# Patient Record
Sex: Female | Born: 1940
Health system: Southern US, Community
[De-identification: ages and names within clinical notes are randomized; demographics above are authoritative.]

## PROBLEM LIST (undated history)

## (undated) DIAGNOSIS — E870 Hyperosmolality and hypernatremia: Secondary | ICD-10-CM

## (undated) DIAGNOSIS — M1990 Unspecified osteoarthritis, unspecified site: Secondary | ICD-10-CM

## (undated) DIAGNOSIS — Z860100 Personal history of colon polyps, unspecified: Secondary | ICD-10-CM

## (undated) DIAGNOSIS — R011 Cardiac murmur, unspecified: Secondary | ICD-10-CM

## (undated) DIAGNOSIS — Z8679 Personal history of other diseases of the circulatory system: Secondary | ICD-10-CM

## (undated) DIAGNOSIS — F32A Depression, unspecified: Secondary | ICD-10-CM

## (undated) DIAGNOSIS — I10 Essential (primary) hypertension: Secondary | ICD-10-CM

## (undated) DIAGNOSIS — F329 Major depressive disorder, single episode, unspecified: Secondary | ICD-10-CM

## (undated) DIAGNOSIS — E785 Hyperlipidemia, unspecified: Secondary | ICD-10-CM

## (undated) HISTORY — DX: Hyperlipidemia, unspecified: E78.5

## (undated) HISTORY — PX: RHINOPLASTY: SUR1284

## (undated) HISTORY — PX: CATARACT EXTRACTION: SUR2

## (undated) HISTORY — PX: KNEE ARTHROPLASTY: SHX992

## (undated) HISTORY — PX: CARDIAC CATHETERIZATION: SHX172

## (undated) HISTORY — PX: TUBAL LIGATION: SHX77

## (undated) HISTORY — PX: TONSILLECTOMY: SUR1361

## (undated) HISTORY — PX: FACIAL COSMETIC SURGERY: SHX629

## (undated) HISTORY — DX: Hyperosmolality and hypernatremia: E87.0

## (undated) HISTORY — PX: TOE SURGERY: SHX1073

---

## 1998-09-19 ENCOUNTER — Other Ambulatory Visit: Admission: RE | Admit: 1998-09-19 | Discharge: 1998-09-19 | Payer: Self-pay | Admitting: Gynecology

## 1999-05-15 ENCOUNTER — Other Ambulatory Visit: Admission: RE | Admit: 1999-05-15 | Discharge: 1999-05-15 | Payer: Self-pay | Admitting: Gynecology

## 1999-12-24 ENCOUNTER — Other Ambulatory Visit: Admission: RE | Admit: 1999-12-24 | Discharge: 1999-12-24 | Payer: Self-pay | Admitting: Gynecology

## 2001-01-26 ENCOUNTER — Other Ambulatory Visit: Admission: RE | Admit: 2001-01-26 | Discharge: 2001-01-26 | Payer: Self-pay | Admitting: Gynecology

## 2002-01-26 ENCOUNTER — Other Ambulatory Visit: Admission: RE | Admit: 2002-01-26 | Discharge: 2002-01-26 | Payer: Self-pay | Admitting: Gynecology

## 2003-01-31 ENCOUNTER — Other Ambulatory Visit: Admission: RE | Admit: 2003-01-31 | Discharge: 2003-01-31 | Payer: Self-pay | Admitting: Gynecology

## 2004-02-17 ENCOUNTER — Inpatient Hospital Stay (HOSPITAL_COMMUNITY): Admission: EM | Admit: 2004-02-17 | Discharge: 2004-02-20 | Payer: Self-pay | Admitting: Cardiology

## 2004-05-01 ENCOUNTER — Other Ambulatory Visit: Admission: RE | Admit: 2004-05-01 | Discharge: 2004-05-01 | Payer: Self-pay | Admitting: Gynecology

## 2005-06-24 ENCOUNTER — Other Ambulatory Visit: Admission: RE | Admit: 2005-06-24 | Discharge: 2005-06-24 | Payer: Self-pay | Admitting: Gynecology

## 2005-09-06 ENCOUNTER — Ambulatory Visit: Payer: Self-pay | Admitting: Cardiology

## 2010-09-30 ENCOUNTER — Encounter: Payer: Self-pay | Admitting: Internal Medicine

## 2010-11-06 ENCOUNTER — Other Ambulatory Visit: Payer: Self-pay | Admitting: Obstetrics and Gynecology

## 2010-11-06 DIAGNOSIS — N61 Mastitis without abscess: Secondary | ICD-10-CM

## 2010-11-08 ENCOUNTER — Ambulatory Visit
Admission: RE | Admit: 2010-11-08 | Discharge: 2010-11-08 | Disposition: A | Discharge status: Home / Self Care | Payer: Medicare Other | Source: Ambulatory Visit | Attending: Obstetrics and Gynecology | Admitting: Obstetrics and Gynecology

## 2010-11-08 DIAGNOSIS — N61 Mastitis without abscess: Secondary | ICD-10-CM

## 2011-12-20 ENCOUNTER — Encounter (INDEPENDENT_AMBULATORY_CARE_PROVIDER_SITE_OTHER): Payer: Self-pay

## 2012-04-16 ENCOUNTER — Encounter (INDEPENDENT_AMBULATORY_CARE_PROVIDER_SITE_OTHER): Payer: Self-pay | Admitting: *Deleted

## 2012-04-23 ENCOUNTER — Telehealth (INDEPENDENT_AMBULATORY_CARE_PROVIDER_SITE_OTHER): Payer: Self-pay | Admitting: *Deleted

## 2012-04-23 ENCOUNTER — Other Ambulatory Visit (INDEPENDENT_AMBULATORY_CARE_PROVIDER_SITE_OTHER): Payer: Self-pay | Admitting: *Deleted

## 2012-04-23 DIAGNOSIS — Z86010 Personal history of colonic polyps: Secondary | ICD-10-CM

## 2012-04-23 DIAGNOSIS — Z800 Family history of malignant neoplasm of digestive organs: Secondary | ICD-10-CM

## 2012-04-23 DIAGNOSIS — Z1211 Encounter for screening for malignant neoplasm of colon: Secondary | ICD-10-CM

## 2012-04-23 NOTE — Telephone Encounter (Signed)
Patient needs movi prep 

## 2012-04-24 MED ORDER — PEG-KCL-NACL-NASULF-NA ASC-C 100 G PO SOLR: 1.0000 | Freq: Once | ORAL | Status: DC

## 2012-05-06 ENCOUNTER — Telehealth (INDEPENDENT_AMBULATORY_CARE_PROVIDER_SITE_OTHER): Payer: Self-pay | Admitting: *Deleted

## 2012-05-06 NOTE — Telephone Encounter (Signed)
PCP/Requesting MD: vyas  Name & DOB: Jane [name] Jan 15, 2041     Procedure: tcs  Reason/Indication:  fam hx colon ca , hx polyps  Has patient had this procedure before?  yes  If so, when, by whom and where?  8/08  Is there a family history of colon cancer?  yes  Who?  What age when diagnosed?  mother  Is patient diabetic?   no      Does patient have prosthetic heart valve?  no  Do you have a pacemaker?  no  Has patient had joint replacement within last 12 months?  no  Is patient on Coumadin, Plavix and/or Aspirin? yes  Medications: asa 81 mg daily, lisinopril 20 mg bid, simvastatin 20 mg daily, citalopram 20 mg daily, estradiol/noreth tb 1/0.5 mg 1/2 tab daily, meclizine 25 mg 1/2 tab prn, vitamins, supllements  Allergies: pcn, cipro  Medication Adjustment: asa 2 days  Procedure date & time: 06/03/12 at 1200

## 2012-05-08 NOTE — Telephone Encounter (Signed)
agree

## 2012-05-26 ENCOUNTER — Encounter (HOSPITAL_COMMUNITY): Payer: Self-pay | Admitting: Pharmacy Technician

## 2012-05-28 ENCOUNTER — Encounter (INDEPENDENT_AMBULATORY_CARE_PROVIDER_SITE_OTHER): Payer: Self-pay | Admitting: *Deleted

## 2012-06-02 MED ORDER — SODIUM CHLORIDE 0.45 % IV SOLN
INTRAVENOUS | Status: DC
  Administered 2012-06-03: 1000 mL via INTRAVENOUS

## 2012-06-03 ENCOUNTER — Encounter (HOSPITAL_COMMUNITY)
Admission: RE | Disposition: A | Discharge status: Home / Self Care | Payer: Self-pay | Source: Ambulatory Visit | Attending: Internal Medicine

## 2012-06-03 ENCOUNTER — Ambulatory Visit (HOSPITAL_COMMUNITY)
Admission: RE | Admit: 2012-06-03 | Discharge: 2012-06-03 | Disposition: A | Discharge status: Home / Self Care | Payer: Medicare Other | Source: Ambulatory Visit | Attending: Internal Medicine | Admitting: Internal Medicine

## 2012-06-03 ENCOUNTER — Encounter (HOSPITAL_COMMUNITY): Payer: Self-pay | Admitting: *Deleted

## 2012-06-03 DIAGNOSIS — Z800 Family history of malignant neoplasm of digestive organs: Secondary | ICD-10-CM | POA: Insufficient documentation

## 2012-06-03 DIAGNOSIS — Z860100 Personal history of colon polyps, unspecified: Secondary | ICD-10-CM | POA: Insufficient documentation

## 2012-06-03 DIAGNOSIS — Z09 Encounter for follow-up examination after completed treatment for conditions other than malignant neoplasm: Secondary | ICD-10-CM | POA: Insufficient documentation

## 2012-06-03 DIAGNOSIS — K644 Residual hemorrhoidal skin tags: Secondary | ICD-10-CM

## 2012-06-03 DIAGNOSIS — D126 Benign neoplasm of colon, unspecified: Secondary | ICD-10-CM | POA: Insufficient documentation

## 2012-06-03 HISTORY — DX: Unspecified osteoarthritis, unspecified site: M19.90

## 2012-06-03 HISTORY — DX: Major depressive disorder, single episode, unspecified: F32.9

## 2012-06-03 HISTORY — PX: COLONOSCOPY: SHX5424

## 2012-06-03 HISTORY — DX: Depression, unspecified: F32.A

## 2012-06-03 HISTORY — DX: Cardiac murmur, unspecified: R01.1

## 2012-06-03 HISTORY — DX: Essential (primary) hypertension: I10

## 2012-06-03 HISTORY — DX: Personal history of colonic polyps: Z86.010

## 2012-06-03 HISTORY — DX: Personal history of colon polyps, unspecified: Z86.0100

## 2012-06-03 HISTORY — DX: Personal history of other diseases of the circulatory system: Z86.79

## 2012-06-03 SURGERY — COLONOSCOPY
Anesthesia: Moderate Sedation

## 2012-06-03 MED ORDER — MEPERIDINE HCL 50 MG/ML IJ SOLN
INTRAMUSCULAR | Status: DC | PRN
  Administered 2012-06-03 (×2): 25 mg via INTRAVENOUS

## 2012-06-03 MED ORDER — MIDAZOLAM HCL 5 MG/5ML IJ SOLN
INTRAMUSCULAR | Status: AC
  Filled 2012-06-03: qty 10

## 2012-06-03 MED ORDER — MIDAZOLAM HCL 5 MG/5ML IJ SOLN
INTRAMUSCULAR | Status: DC | PRN
  Administered 2012-06-03 (×3): 2 mg via INTRAVENOUS

## 2012-06-03 MED ORDER — STERILE WATER FOR IRRIGATION IR SOLN
Status: DC | PRN
  Administered 2012-06-03: 11:00:00

## 2012-06-03 MED ORDER — MEPERIDINE HCL 50 MG/ML IJ SOLN
INTRAMUSCULAR | Status: AC
  Filled 2012-06-03: qty 1

## 2012-06-03 NOTE — H&P (Signed)
Jane [name] is an 71 y.o. female.   Chief Complaint: Patient is here for colonoscopy. HPI: Patient is 71 year-old female with history of colonic polyps and family history of colon carcinoma.  Her last exam was in 2008. She denies abdominal pain rectal bleeding or change in bowel habits. Mother had colon carcinoma in her 58s or early 6s.  Past Medical History  Diagnosis Date  . Hypertension   . Heart murmur   . Arthritis   . Depression   . Hx of colonic polyps   . H/O: rheumatic fever     as child    Past Surgical History  Procedure Date  . Tubal ligation   . Toe surgery   . Tonsillectomy   . Rhinoplasty   . Facial cosmetic surgery   . Cardiac catheterization     History reviewed. No pertinent family history. Social History:  reports that she has never smoked. She does not have any smokeless tobacco history on file. She reports that she drinks alcohol. She reports that she does not use illicit drugs.  Allergies:  Allergies  Allergen Reactions  . Ciprofloxacin Hcl     Hand stiffness   . Other     Has a very low tolerance to sedation and stimulating drugs   . Penicillins Diarrhea and Rash    Medications Prior to Admission  Medication Sig Dispense Refill  . aspirin EC 81 MG tablet Take 81 mg by mouth at bedtime.      . calcium-vitamin D (OSCAL WITH D) 500-200 MG-UNIT per tablet Take 1 tablet by mouth 2 (two) times daily.      . citalopram (CELEXA) 20 MG tablet Take 20 mg by mouth at bedtime.      . Coenzyme Q10 (COQ10 PO) Take 1 tablet by mouth daily.      Marland Kitchen estradiol-norethindrone (ACTIVELLA) 1-0.5 MG per tablet Take 0.5 tablets by mouth at bedtime.      . fish oil-omega-3 fatty acids 1000 MG capsule Take 1 g by mouth 2 (two) times daily.      Marland Kitchen ketotifen (ALAWAY) 0.025 % ophthalmic solution Place 1 drop into both eyes daily as needed. For allergies      . lisinopril (PRINIVIL,ZESTRIL) 20 MG tablet Take 20 mg by mouth 2 (two) times daily.      . meclizine (ANTIVERT) 25  MG tablet Take 12.5-25 mg by mouth daily as needed. .-1/2-1 tablet as needed for vertigo      . Misc Natural Products (OSTEO BI-FLEX TRIPLE STRENGTH PO) Take 1 tablet by mouth 2 (two) times daily.      . Multiple Vitamins-Minerals (MULTIVITAMINS THER. W/MINERALS) TABS Take 1 tablet by mouth daily.      . naproxen sodium (ANAPROX) 220 MG tablet Take 220 mg by mouth daily as needed. For headaches      . polyvinyl alcohol-povidone (REFRESH) 1.4-0.6 % ophthalmic solution Place 1-2 drops into both eyes as needed. For dry eyes      . simvastatin (ZOCOR) 20 MG tablet Take 20 mg by mouth at bedtime.      . Tetrahydrozoline HCl (MURINE FOR RED EYES OP) Apply 1 drop to eye daily as needed. For red, dry eyes      . vitamin C (ASCORBIC ACID) 500 MG tablet Take 500 mg by mouth 2 (two) times daily.        No results found for this or any previous visit (from the past 48 hour(s)). No results found.  ROS  Blood pressure 152/75, pulse 76, temperature 98.2 F (36.8 C), temperature source Oral, resp. rate 19, height 5' 1.5" (1.562 m), weight 172 lb (78.019 kg), SpO2 96.00%. Physical Exam  Constitutional: She appears well-developed and well-nourished.  HENT:  Mouth/Throat: Oropharynx is clear and moist. No oropharyngeal exudate.  Eyes: Conjunctivae normal are normal. No scleral icterus.  Neck: No thyromegaly present.  Cardiovascular: Normal rate, regular rhythm and normal heart sounds.   No murmur heard. Respiratory: Effort normal and breath sounds normal.  GI: Soft. She exhibits no distension and no mass. There is no tenderness.  Musculoskeletal: She exhibits no edema.  Lymphadenopathy:    She has no cervical adenopathy.  Neurological: She is alert.  Skin: Skin is warm and dry.     Assessment/Plan History of colonic polyps. Family history of colon carcinoma. Surveillance colonoscopy.  REHMAN,NAJEEB U 06/03/2012, 11:29 AM

## 2012-06-03 NOTE — Op Note (Signed)
COLONOSCOPY PROCEDURE REPORT  PATIENT:  Jane [name]  MR#:  [id number] Birthdate:  May 31, 1941, 71 y.o., female Endoscopist:  Dr. Malissa Hippo, MD Referred By:  Dr. Ignatius Specking, MD Procedure Date: 06/03/2012  Procedure:   Colonoscopy  Indications:  Patient is 71 year old Caucasian female with history of colonic polyps and family history of colon carcinoma. She is undergoing surveillance colonoscopy.  Informed Consent:  The procedure and risks were reviewed with the patient and informed consent was obtained.  Medications:  Demerol 50 mg IV Versed 6 mg IV  Description of procedure:  After a digital rectal exam was performed, that colonoscope was advanced from the anus through the rectum and colon to the area of the cecum, ileocecal valve and appendiceal orifice. The cecum was deeply intubated. These structures were well-seen and photographed for the record. From the level of the cecum and ileocecal valve, the scope was slowly and cautiously withdrawn. The mucosal surfaces were carefully surveyed utilizing scope tip to flexion to facilitate fold flattening as needed. The scope was pulled down into the rectum where a thorough exam including retroflexion was performed.  Findings:   Prep satisfactory. Small polyp ablated via cold biopsy from hepatic flexure. Small polyp of there are cold biopsy from splenic flexure. Normal rectal mucosa. Small hemorrhoids below the dentate line.  Therapeutic/Diagnostic Maneuvers Performed:  See above  Complications:  None  Cecal Withdrawal Time:  12 minutes  Impression:  Examination performed to cecum. Two small polyps ablated via cold biopsy and submitted together(hepatic and splenic flexure). Small external hemorrhoids.  Recommendations:  Standard instructions given. I would be contacting patient with biopsy results and further recommendations.  Jane [name] U  06/03/2012 12:05 PM  CC: Dr. Ignatius Specking., MD & Dr. Bonnetta Barry ref. provider  found

## 2012-06-08 ENCOUNTER — Encounter (HOSPITAL_COMMUNITY): Payer: Self-pay | Admitting: Internal Medicine

## 2012-06-11 ENCOUNTER — Encounter (INDEPENDENT_AMBULATORY_CARE_PROVIDER_SITE_OTHER): Payer: Self-pay | Admitting: *Deleted

## 2015-12-26 ENCOUNTER — Other Ambulatory Visit: Payer: Self-pay | Admitting: Obstetrics & Gynecology

## 2015-12-26 DIAGNOSIS — Z124 Encounter for screening for malignant neoplasm of cervix: Secondary | ICD-10-CM | POA: Diagnosis not present

## 2015-12-28 LAB — CYTOLOGY - PAP

## 2016-01-04 DIAGNOSIS — Z299 Encounter for prophylactic measures, unspecified: Secondary | ICD-10-CM | POA: Diagnosis not present

## 2016-01-04 DIAGNOSIS — Z0000 Encounter for general adult medical examination without abnormal findings: Secondary | ICD-10-CM | POA: Diagnosis not present

## 2016-01-04 DIAGNOSIS — Z1211 Encounter for screening for malignant neoplasm of colon: Secondary | ICD-10-CM | POA: Diagnosis not present

## 2016-01-04 DIAGNOSIS — F329 Major depressive disorder, single episode, unspecified: Secondary | ICD-10-CM | POA: Diagnosis not present

## 2016-01-04 DIAGNOSIS — Z7189 Other specified counseling: Secondary | ICD-10-CM | POA: Diagnosis not present

## 2016-01-04 DIAGNOSIS — Z1389 Encounter for screening for other disorder: Secondary | ICD-10-CM | POA: Diagnosis not present

## 2016-01-05 DIAGNOSIS — R5383 Other fatigue: Secondary | ICD-10-CM | POA: Diagnosis not present

## 2016-01-05 DIAGNOSIS — E7800 Pure hypercholesterolemia, unspecified: Secondary | ICD-10-CM | POA: Diagnosis not present

## 2016-01-05 DIAGNOSIS — Z79899 Other long term (current) drug therapy: Secondary | ICD-10-CM | POA: Diagnosis not present

## 2016-01-31 DIAGNOSIS — E7800 Pure hypercholesterolemia, unspecified: Secondary | ICD-10-CM | POA: Diagnosis not present

## 2016-01-31 DIAGNOSIS — I10 Essential (primary) hypertension: Secondary | ICD-10-CM | POA: Diagnosis not present

## 2016-02-13 DIAGNOSIS — E2839 Other primary ovarian failure: Secondary | ICD-10-CM | POA: Diagnosis not present

## 2016-02-14 DIAGNOSIS — M546 Pain in thoracic spine: Secondary | ICD-10-CM | POA: Diagnosis not present

## 2016-02-14 DIAGNOSIS — M47812 Spondylosis without myelopathy or radiculopathy, cervical region: Secondary | ICD-10-CM | POA: Diagnosis not present

## 2016-02-14 DIAGNOSIS — M47816 Spondylosis without myelopathy or radiculopathy, lumbar region: Secondary | ICD-10-CM | POA: Diagnosis not present

## 2016-02-14 DIAGNOSIS — M9903 Segmental and somatic dysfunction of lumbar region: Secondary | ICD-10-CM | POA: Diagnosis not present

## 2016-02-14 DIAGNOSIS — M9901 Segmental and somatic dysfunction of cervical region: Secondary | ICD-10-CM | POA: Diagnosis not present

## 2016-02-14 DIAGNOSIS — M9902 Segmental and somatic dysfunction of thoracic region: Secondary | ICD-10-CM | POA: Diagnosis not present

## 2016-02-29 DIAGNOSIS — M2550 Pain in unspecified joint: Secondary | ICD-10-CM | POA: Diagnosis not present

## 2016-02-29 DIAGNOSIS — I10 Essential (primary) hypertension: Secondary | ICD-10-CM | POA: Diagnosis not present

## 2016-02-29 DIAGNOSIS — M8580 Other specified disorders of bone density and structure, unspecified site: Secondary | ICD-10-CM | POA: Diagnosis not present

## 2016-02-29 DIAGNOSIS — M1990 Unspecified osteoarthritis, unspecified site: Secondary | ICD-10-CM | POA: Diagnosis not present

## 2016-03-05 DIAGNOSIS — I10 Essential (primary) hypertension: Secondary | ICD-10-CM | POA: Diagnosis not present

## 2016-03-05 DIAGNOSIS — E7800 Pure hypercholesterolemia, unspecified: Secondary | ICD-10-CM | POA: Diagnosis not present

## 2016-03-08 DIAGNOSIS — M25562 Pain in left knee: Secondary | ICD-10-CM | POA: Diagnosis not present

## 2016-03-08 DIAGNOSIS — R030 Elevated blood-pressure reading, without diagnosis of hypertension: Secondary | ICD-10-CM | POA: Diagnosis not present

## 2016-03-08 DIAGNOSIS — I10 Essential (primary) hypertension: Secondary | ICD-10-CM | POA: Diagnosis not present

## 2016-03-08 DIAGNOSIS — E7800 Pure hypercholesterolemia, unspecified: Secondary | ICD-10-CM | POA: Diagnosis not present

## 2016-03-08 DIAGNOSIS — Z79899 Other long term (current) drug therapy: Secondary | ICD-10-CM | POA: Diagnosis not present

## 2016-03-08 DIAGNOSIS — M7122 Synovial cyst of popliteal space [Baker], left knee: Secondary | ICD-10-CM | POA: Diagnosis not present

## 2016-03-08 DIAGNOSIS — M25462 Effusion, left knee: Secondary | ICD-10-CM | POA: Diagnosis not present

## 2016-03-11 DIAGNOSIS — M25562 Pain in left knee: Secondary | ICD-10-CM | POA: Diagnosis not present

## 2016-03-11 DIAGNOSIS — Z789 Other specified health status: Secondary | ICD-10-CM | POA: Diagnosis not present

## 2016-03-11 DIAGNOSIS — Z299 Encounter for prophylactic measures, unspecified: Secondary | ICD-10-CM | POA: Diagnosis not present

## 2016-03-13 DIAGNOSIS — M25562 Pain in left knee: Secondary | ICD-10-CM | POA: Diagnosis not present

## 2016-03-20 ENCOUNTER — Other Ambulatory Visit: Payer: Self-pay | Admitting: Orthopedic Surgery

## 2016-03-20 DIAGNOSIS — M25562 Pain in left knee: Secondary | ICD-10-CM

## 2016-03-27 DIAGNOSIS — M7122 Synovial cyst of popliteal space [Baker], left knee: Secondary | ICD-10-CM | POA: Diagnosis not present

## 2016-03-27 DIAGNOSIS — S83242A Other tear of medial meniscus, current injury, left knee, initial encounter: Secondary | ICD-10-CM | POA: Diagnosis not present

## 2016-04-02 DIAGNOSIS — M23322 Other meniscus derangements, posterior horn of medial meniscus, left knee: Secondary | ICD-10-CM | POA: Diagnosis not present

## 2016-04-04 DIAGNOSIS — Z1231 Encounter for screening mammogram for malignant neoplasm of breast: Secondary | ICD-10-CM | POA: Diagnosis not present

## 2016-04-10 DIAGNOSIS — M23322 Other meniscus derangements, posterior horn of medial meniscus, left knee: Secondary | ICD-10-CM | POA: Diagnosis not present

## 2016-04-10 DIAGNOSIS — M6752 Plica syndrome, left knee: Secondary | ICD-10-CM | POA: Diagnosis not present

## 2016-04-10 DIAGNOSIS — M94262 Chondromalacia, left knee: Secondary | ICD-10-CM | POA: Diagnosis not present

## 2016-04-10 DIAGNOSIS — M23222 Derangement of posterior horn of medial meniscus due to old tear or injury, left knee: Secondary | ICD-10-CM | POA: Diagnosis not present

## 2016-04-10 DIAGNOSIS — M1712 Unilateral primary osteoarthritis, left knee: Secondary | ICD-10-CM | POA: Diagnosis not present

## 2016-04-10 DIAGNOSIS — G8918 Other acute postprocedural pain: Secondary | ICD-10-CM | POA: Diagnosis not present

## 2016-04-23 DIAGNOSIS — Z4789 Encounter for other orthopedic aftercare: Secondary | ICD-10-CM | POA: Diagnosis not present

## 2016-04-26 DIAGNOSIS — M6281 Muscle weakness (generalized): Secondary | ICD-10-CM | POA: Diagnosis not present

## 2016-04-26 DIAGNOSIS — M23204 Derangement of unspecified medial meniscus due to old tear or injury, left knee: Secondary | ICD-10-CM | POA: Diagnosis not present

## 2016-04-26 DIAGNOSIS — R2689 Other abnormalities of gait and mobility: Secondary | ICD-10-CM | POA: Diagnosis not present

## 2016-04-29 DIAGNOSIS — R2689 Other abnormalities of gait and mobility: Secondary | ICD-10-CM | POA: Diagnosis not present

## 2016-04-29 DIAGNOSIS — I10 Essential (primary) hypertension: Secondary | ICD-10-CM | POA: Diagnosis not present

## 2016-04-29 DIAGNOSIS — E7800 Pure hypercholesterolemia, unspecified: Secondary | ICD-10-CM | POA: Diagnosis not present

## 2016-04-29 DIAGNOSIS — M23204 Derangement of unspecified medial meniscus due to old tear or injury, left knee: Secondary | ICD-10-CM | POA: Diagnosis not present

## 2016-04-29 DIAGNOSIS — M6281 Muscle weakness (generalized): Secondary | ICD-10-CM | POA: Diagnosis not present

## 2016-05-03 DIAGNOSIS — R2689 Other abnormalities of gait and mobility: Secondary | ICD-10-CM | POA: Diagnosis not present

## 2016-05-03 DIAGNOSIS — M23204 Derangement of unspecified medial meniscus due to old tear or injury, left knee: Secondary | ICD-10-CM | POA: Diagnosis not present

## 2016-05-03 DIAGNOSIS — M6281 Muscle weakness (generalized): Secondary | ICD-10-CM | POA: Diagnosis not present

## 2016-05-07 DIAGNOSIS — Z4789 Encounter for other orthopedic aftercare: Secondary | ICD-10-CM | POA: Diagnosis not present

## 2016-05-08 DIAGNOSIS — R2689 Other abnormalities of gait and mobility: Secondary | ICD-10-CM | POA: Diagnosis not present

## 2016-05-08 DIAGNOSIS — M6281 Muscle weakness (generalized): Secondary | ICD-10-CM | POA: Diagnosis not present

## 2016-05-08 DIAGNOSIS — M23204 Derangement of unspecified medial meniscus due to old tear or injury, left knee: Secondary | ICD-10-CM | POA: Diagnosis not present

## 2016-05-09 DIAGNOSIS — R2689 Other abnormalities of gait and mobility: Secondary | ICD-10-CM | POA: Diagnosis not present

## 2016-05-09 DIAGNOSIS — M23204 Derangement of unspecified medial meniscus due to old tear or injury, left knee: Secondary | ICD-10-CM | POA: Diagnosis not present

## 2016-05-09 DIAGNOSIS — M6281 Muscle weakness (generalized): Secondary | ICD-10-CM | POA: Diagnosis not present

## 2016-05-10 DIAGNOSIS — R2689 Other abnormalities of gait and mobility: Secondary | ICD-10-CM | POA: Diagnosis not present

## 2016-05-10 DIAGNOSIS — M23204 Derangement of unspecified medial meniscus due to old tear or injury, left knee: Secondary | ICD-10-CM | POA: Diagnosis not present

## 2016-05-10 DIAGNOSIS — M6281 Muscle weakness (generalized): Secondary | ICD-10-CM | POA: Diagnosis not present

## 2016-05-15 DIAGNOSIS — M6281 Muscle weakness (generalized): Secondary | ICD-10-CM | POA: Diagnosis not present

## 2016-05-15 DIAGNOSIS — M23204 Derangement of unspecified medial meniscus due to old tear or injury, left knee: Secondary | ICD-10-CM | POA: Diagnosis not present

## 2016-05-15 DIAGNOSIS — R2689 Other abnormalities of gait and mobility: Secondary | ICD-10-CM | POA: Diagnosis not present

## 2016-05-17 DIAGNOSIS — M23204 Derangement of unspecified medial meniscus due to old tear or injury, left knee: Secondary | ICD-10-CM | POA: Diagnosis not present

## 2016-05-17 DIAGNOSIS — R2689 Other abnormalities of gait and mobility: Secondary | ICD-10-CM | POA: Diagnosis not present

## 2016-05-17 DIAGNOSIS — M6281 Muscle weakness (generalized): Secondary | ICD-10-CM | POA: Diagnosis not present

## 2016-05-20 DIAGNOSIS — M6281 Muscle weakness (generalized): Secondary | ICD-10-CM | POA: Diagnosis not present

## 2016-05-20 DIAGNOSIS — M23204 Derangement of unspecified medial meniscus due to old tear or injury, left knee: Secondary | ICD-10-CM | POA: Diagnosis not present

## 2016-05-20 DIAGNOSIS — R2689 Other abnormalities of gait and mobility: Secondary | ICD-10-CM | POA: Diagnosis not present

## 2016-05-22 DIAGNOSIS — M25562 Pain in left knee: Secondary | ICD-10-CM | POA: Diagnosis not present

## 2016-05-22 DIAGNOSIS — M79672 Pain in left foot: Secondary | ICD-10-CM | POA: Diagnosis not present

## 2016-05-22 DIAGNOSIS — M79641 Pain in right hand: Secondary | ICD-10-CM | POA: Diagnosis not present

## 2016-05-22 DIAGNOSIS — M25561 Pain in right knee: Secondary | ICD-10-CM | POA: Diagnosis not present

## 2016-05-22 DIAGNOSIS — M79671 Pain in right foot: Secondary | ICD-10-CM | POA: Diagnosis not present

## 2016-05-22 DIAGNOSIS — M79642 Pain in left hand: Secondary | ICD-10-CM | POA: Diagnosis not present

## 2016-05-22 DIAGNOSIS — R5381 Other malaise: Secondary | ICD-10-CM | POA: Diagnosis not present

## 2016-05-22 DIAGNOSIS — M2550 Pain in unspecified joint: Secondary | ICD-10-CM | POA: Diagnosis not present

## 2016-05-22 DIAGNOSIS — M810 Age-related osteoporosis without current pathological fracture: Secondary | ICD-10-CM | POA: Diagnosis not present

## 2016-05-24 DIAGNOSIS — M6281 Muscle weakness (generalized): Secondary | ICD-10-CM | POA: Diagnosis not present

## 2016-05-24 DIAGNOSIS — M23204 Derangement of unspecified medial meniscus due to old tear or injury, left knee: Secondary | ICD-10-CM | POA: Diagnosis not present

## 2016-05-24 DIAGNOSIS — R2689 Other abnormalities of gait and mobility: Secondary | ICD-10-CM | POA: Diagnosis not present

## 2016-05-27 DIAGNOSIS — R2689 Other abnormalities of gait and mobility: Secondary | ICD-10-CM | POA: Diagnosis not present

## 2016-05-27 DIAGNOSIS — M23204 Derangement of unspecified medial meniscus due to old tear or injury, left knee: Secondary | ICD-10-CM | POA: Diagnosis not present

## 2016-05-27 DIAGNOSIS — M6281 Muscle weakness (generalized): Secondary | ICD-10-CM | POA: Diagnosis not present

## 2016-05-29 DIAGNOSIS — M23204 Derangement of unspecified medial meniscus due to old tear or injury, left knee: Secondary | ICD-10-CM | POA: Diagnosis not present

## 2016-05-29 DIAGNOSIS — R2689 Other abnormalities of gait and mobility: Secondary | ICD-10-CM | POA: Diagnosis not present

## 2016-05-29 DIAGNOSIS — M6281 Muscle weakness (generalized): Secondary | ICD-10-CM | POA: Diagnosis not present

## 2016-05-30 DIAGNOSIS — I10 Essential (primary) hypertension: Secondary | ICD-10-CM | POA: Diagnosis not present

## 2016-05-30 DIAGNOSIS — E7800 Pure hypercholesterolemia, unspecified: Secondary | ICD-10-CM | POA: Diagnosis not present

## 2016-05-31 DIAGNOSIS — R2689 Other abnormalities of gait and mobility: Secondary | ICD-10-CM | POA: Diagnosis not present

## 2016-05-31 DIAGNOSIS — M6281 Muscle weakness (generalized): Secondary | ICD-10-CM | POA: Diagnosis not present

## 2016-05-31 DIAGNOSIS — M23204 Derangement of unspecified medial meniscus due to old tear or injury, left knee: Secondary | ICD-10-CM | POA: Diagnosis not present

## 2016-06-03 DIAGNOSIS — R2689 Other abnormalities of gait and mobility: Secondary | ICD-10-CM | POA: Diagnosis not present

## 2016-06-03 DIAGNOSIS — M6281 Muscle weakness (generalized): Secondary | ICD-10-CM | POA: Diagnosis not present

## 2016-06-03 DIAGNOSIS — M23204 Derangement of unspecified medial meniscus due to old tear or injury, left knee: Secondary | ICD-10-CM | POA: Diagnosis not present

## 2016-06-04 DIAGNOSIS — M25562 Pain in left knee: Secondary | ICD-10-CM | POA: Diagnosis not present

## 2016-06-04 DIAGNOSIS — Z4789 Encounter for other orthopedic aftercare: Secondary | ICD-10-CM | POA: Diagnosis not present

## 2016-06-04 DIAGNOSIS — M23322 Other meniscus derangements, posterior horn of medial meniscus, left knee: Secondary | ICD-10-CM | POA: Diagnosis not present

## 2016-06-06 DIAGNOSIS — M23204 Derangement of unspecified medial meniscus due to old tear or injury, left knee: Secondary | ICD-10-CM | POA: Diagnosis not present

## 2016-06-06 DIAGNOSIS — M6281 Muscle weakness (generalized): Secondary | ICD-10-CM | POA: Diagnosis not present

## 2016-06-06 DIAGNOSIS — R2689 Other abnormalities of gait and mobility: Secondary | ICD-10-CM | POA: Diagnosis not present

## 2016-06-07 DIAGNOSIS — M6281 Muscle weakness (generalized): Secondary | ICD-10-CM | POA: Diagnosis not present

## 2016-06-07 DIAGNOSIS — M23204 Derangement of unspecified medial meniscus due to old tear or injury, left knee: Secondary | ICD-10-CM | POA: Diagnosis not present

## 2016-06-07 DIAGNOSIS — R2689 Other abnormalities of gait and mobility: Secondary | ICD-10-CM | POA: Diagnosis not present

## 2016-06-10 DIAGNOSIS — R2689 Other abnormalities of gait and mobility: Secondary | ICD-10-CM | POA: Diagnosis not present

## 2016-06-10 DIAGNOSIS — M6281 Muscle weakness (generalized): Secondary | ICD-10-CM | POA: Diagnosis not present

## 2016-06-10 DIAGNOSIS — M23204 Derangement of unspecified medial meniscus due to old tear or injury, left knee: Secondary | ICD-10-CM | POA: Diagnosis not present

## 2016-06-12 DIAGNOSIS — M23204 Derangement of unspecified medial meniscus due to old tear or injury, left knee: Secondary | ICD-10-CM | POA: Diagnosis not present

## 2016-06-12 DIAGNOSIS — R2689 Other abnormalities of gait and mobility: Secondary | ICD-10-CM | POA: Diagnosis not present

## 2016-06-12 DIAGNOSIS — M6281 Muscle weakness (generalized): Secondary | ICD-10-CM | POA: Diagnosis not present

## 2016-06-17 DIAGNOSIS — M6281 Muscle weakness (generalized): Secondary | ICD-10-CM | POA: Diagnosis not present

## 2016-06-17 DIAGNOSIS — M23204 Derangement of unspecified medial meniscus due to old tear or injury, left knee: Secondary | ICD-10-CM | POA: Diagnosis not present

## 2016-06-17 DIAGNOSIS — R2689 Other abnormalities of gait and mobility: Secondary | ICD-10-CM | POA: Diagnosis not present

## 2016-06-19 DIAGNOSIS — R2689 Other abnormalities of gait and mobility: Secondary | ICD-10-CM | POA: Diagnosis not present

## 2016-06-19 DIAGNOSIS — M23204 Derangement of unspecified medial meniscus due to old tear or injury, left knee: Secondary | ICD-10-CM | POA: Diagnosis not present

## 2016-06-19 DIAGNOSIS — M6281 Muscle weakness (generalized): Secondary | ICD-10-CM | POA: Diagnosis not present

## 2016-06-21 DIAGNOSIS — Z4789 Encounter for other orthopedic aftercare: Secondary | ICD-10-CM | POA: Diagnosis not present

## 2016-06-25 ENCOUNTER — Ambulatory Visit (INDEPENDENT_AMBULATORY_CARE_PROVIDER_SITE_OTHER): Payer: Medicare Other | Admitting: Rheumatology

## 2016-06-25 DIAGNOSIS — M19071 Primary osteoarthritis, right ankle and foot: Secondary | ICD-10-CM

## 2016-06-25 DIAGNOSIS — M170 Bilateral primary osteoarthritis of knee: Secondary | ICD-10-CM

## 2016-06-25 DIAGNOSIS — M810 Age-related osteoporosis without current pathological fracture: Secondary | ICD-10-CM

## 2016-06-25 DIAGNOSIS — M19041 Primary osteoarthritis, right hand: Secondary | ICD-10-CM | POA: Diagnosis not present

## 2016-07-04 DIAGNOSIS — Z23 Encounter for immunization: Secondary | ICD-10-CM | POA: Diagnosis not present

## 2016-07-26 DIAGNOSIS — M546 Pain in thoracic spine: Secondary | ICD-10-CM | POA: Diagnosis not present

## 2016-07-26 DIAGNOSIS — M47816 Spondylosis without myelopathy or radiculopathy, lumbar region: Secondary | ICD-10-CM | POA: Diagnosis not present

## 2016-07-26 DIAGNOSIS — M9901 Segmental and somatic dysfunction of cervical region: Secondary | ICD-10-CM | POA: Diagnosis not present

## 2016-07-26 DIAGNOSIS — M9903 Segmental and somatic dysfunction of lumbar region: Secondary | ICD-10-CM | POA: Diagnosis not present

## 2016-07-26 DIAGNOSIS — M9902 Segmental and somatic dysfunction of thoracic region: Secondary | ICD-10-CM | POA: Diagnosis not present

## 2016-07-26 DIAGNOSIS — M47812 Spondylosis without myelopathy or radiculopathy, cervical region: Secondary | ICD-10-CM | POA: Diagnosis not present

## 2016-07-29 DIAGNOSIS — M47816 Spondylosis without myelopathy or radiculopathy, lumbar region: Secondary | ICD-10-CM | POA: Diagnosis not present

## 2016-07-29 DIAGNOSIS — M9902 Segmental and somatic dysfunction of thoracic region: Secondary | ICD-10-CM | POA: Diagnosis not present

## 2016-07-29 DIAGNOSIS — M9903 Segmental and somatic dysfunction of lumbar region: Secondary | ICD-10-CM | POA: Diagnosis not present

## 2016-07-29 DIAGNOSIS — M9901 Segmental and somatic dysfunction of cervical region: Secondary | ICD-10-CM | POA: Diagnosis not present

## 2016-07-29 DIAGNOSIS — M546 Pain in thoracic spine: Secondary | ICD-10-CM | POA: Diagnosis not present

## 2016-07-29 DIAGNOSIS — M47812 Spondylosis without myelopathy or radiculopathy, cervical region: Secondary | ICD-10-CM | POA: Diagnosis not present

## 2016-07-31 DIAGNOSIS — M7989 Other specified soft tissue disorders: Secondary | ICD-10-CM | POA: Diagnosis not present

## 2016-07-31 DIAGNOSIS — M47812 Spondylosis without myelopathy or radiculopathy, cervical region: Secondary | ICD-10-CM | POA: Diagnosis not present

## 2016-07-31 DIAGNOSIS — X500XXA Overexertion from strenuous movement or load, initial encounter: Secondary | ICD-10-CM | POA: Diagnosis not present

## 2016-07-31 DIAGNOSIS — S63501A Unspecified sprain of right wrist, initial encounter: Secondary | ICD-10-CM | POA: Diagnosis not present

## 2016-07-31 DIAGNOSIS — M9901 Segmental and somatic dysfunction of cervical region: Secondary | ICD-10-CM | POA: Diagnosis not present

## 2016-07-31 DIAGNOSIS — M546 Pain in thoracic spine: Secondary | ICD-10-CM | POA: Diagnosis not present

## 2016-07-31 DIAGNOSIS — I10 Essential (primary) hypertension: Secondary | ICD-10-CM | POA: Diagnosis not present

## 2016-07-31 DIAGNOSIS — Z79899 Other long term (current) drug therapy: Secondary | ICD-10-CM | POA: Diagnosis not present

## 2016-07-31 DIAGNOSIS — Z7982 Long term (current) use of aspirin: Secondary | ICD-10-CM | POA: Diagnosis not present

## 2016-07-31 DIAGNOSIS — E7800 Pure hypercholesterolemia, unspecified: Secondary | ICD-10-CM | POA: Diagnosis not present

## 2016-07-31 DIAGNOSIS — M47816 Spondylosis without myelopathy or radiculopathy, lumbar region: Secondary | ICD-10-CM | POA: Diagnosis not present

## 2016-07-31 DIAGNOSIS — M25531 Pain in right wrist: Secondary | ICD-10-CM | POA: Diagnosis not present

## 2016-07-31 DIAGNOSIS — M9902 Segmental and somatic dysfunction of thoracic region: Secondary | ICD-10-CM | POA: Diagnosis not present

## 2016-07-31 DIAGNOSIS — M9903 Segmental and somatic dysfunction of lumbar region: Secondary | ICD-10-CM | POA: Diagnosis not present

## 2016-08-05 DIAGNOSIS — M47816 Spondylosis without myelopathy or radiculopathy, lumbar region: Secondary | ICD-10-CM | POA: Diagnosis not present

## 2016-08-05 DIAGNOSIS — M9901 Segmental and somatic dysfunction of cervical region: Secondary | ICD-10-CM | POA: Diagnosis not present

## 2016-08-05 DIAGNOSIS — M546 Pain in thoracic spine: Secondary | ICD-10-CM | POA: Diagnosis not present

## 2016-08-05 DIAGNOSIS — M9902 Segmental and somatic dysfunction of thoracic region: Secondary | ICD-10-CM | POA: Diagnosis not present

## 2016-08-05 DIAGNOSIS — M47812 Spondylosis without myelopathy or radiculopathy, cervical region: Secondary | ICD-10-CM | POA: Diagnosis not present

## 2016-08-05 DIAGNOSIS — M9903 Segmental and somatic dysfunction of lumbar region: Secondary | ICD-10-CM | POA: Diagnosis not present

## 2016-08-07 DIAGNOSIS — M546 Pain in thoracic spine: Secondary | ICD-10-CM | POA: Diagnosis not present

## 2016-08-07 DIAGNOSIS — M9902 Segmental and somatic dysfunction of thoracic region: Secondary | ICD-10-CM | POA: Diagnosis not present

## 2016-08-07 DIAGNOSIS — M47812 Spondylosis without myelopathy or radiculopathy, cervical region: Secondary | ICD-10-CM | POA: Diagnosis not present

## 2016-08-07 DIAGNOSIS — M47816 Spondylosis without myelopathy or radiculopathy, lumbar region: Secondary | ICD-10-CM | POA: Diagnosis not present

## 2016-08-07 DIAGNOSIS — M9903 Segmental and somatic dysfunction of lumbar region: Secondary | ICD-10-CM | POA: Diagnosis not present

## 2016-08-07 DIAGNOSIS — M9901 Segmental and somatic dysfunction of cervical region: Secondary | ICD-10-CM | POA: Diagnosis not present

## 2016-08-12 DIAGNOSIS — M9901 Segmental and somatic dysfunction of cervical region: Secondary | ICD-10-CM | POA: Diagnosis not present

## 2016-08-12 DIAGNOSIS — M47816 Spondylosis without myelopathy or radiculopathy, lumbar region: Secondary | ICD-10-CM | POA: Diagnosis not present

## 2016-08-12 DIAGNOSIS — M546 Pain in thoracic spine: Secondary | ICD-10-CM | POA: Diagnosis not present

## 2016-08-12 DIAGNOSIS — M9902 Segmental and somatic dysfunction of thoracic region: Secondary | ICD-10-CM | POA: Diagnosis not present

## 2016-08-12 DIAGNOSIS — M9903 Segmental and somatic dysfunction of lumbar region: Secondary | ICD-10-CM | POA: Diagnosis not present

## 2016-08-12 DIAGNOSIS — M47812 Spondylosis without myelopathy or radiculopathy, cervical region: Secondary | ICD-10-CM | POA: Diagnosis not present

## 2016-08-16 DIAGNOSIS — M25562 Pain in left knee: Secondary | ICD-10-CM | POA: Diagnosis not present

## 2016-08-16 DIAGNOSIS — Z299 Encounter for prophylactic measures, unspecified: Secondary | ICD-10-CM | POA: Diagnosis not present

## 2016-08-16 DIAGNOSIS — Z713 Dietary counseling and surveillance: Secondary | ICD-10-CM | POA: Diagnosis not present

## 2016-08-16 DIAGNOSIS — Z6833 Body mass index (BMI) 33.0-33.9, adult: Secondary | ICD-10-CM | POA: Diagnosis not present

## 2016-08-16 DIAGNOSIS — I10 Essential (primary) hypertension: Secondary | ICD-10-CM | POA: Diagnosis not present

## 2016-09-11 DIAGNOSIS — I83893 Varicose veins of bilateral lower extremities with other complications: Secondary | ICD-10-CM | POA: Diagnosis not present

## 2016-09-17 DIAGNOSIS — Z9181 History of falling: Secondary | ICD-10-CM | POA: Diagnosis not present

## 2016-09-17 DIAGNOSIS — M25531 Pain in right wrist: Secondary | ICD-10-CM | POA: Diagnosis not present

## 2016-10-10 DIAGNOSIS — M25562 Pain in left knee: Secondary | ICD-10-CM | POA: Diagnosis not present

## 2016-10-10 DIAGNOSIS — M1712 Unilateral primary osteoarthritis, left knee: Secondary | ICD-10-CM | POA: Diagnosis not present

## 2016-10-10 DIAGNOSIS — G8929 Other chronic pain: Secondary | ICD-10-CM | POA: Diagnosis not present

## 2016-10-12 DIAGNOSIS — Z23 Encounter for immunization: Secondary | ICD-10-CM | POA: Diagnosis not present

## 2016-11-05 DIAGNOSIS — M1712 Unilateral primary osteoarthritis, left knee: Secondary | ICD-10-CM | POA: Diagnosis not present

## 2016-11-12 DIAGNOSIS — F332 Major depressive disorder, recurrent severe without psychotic features: Secondary | ICD-10-CM | POA: Diagnosis not present

## 2016-11-12 DIAGNOSIS — R0602 Shortness of breath: Secondary | ICD-10-CM | POA: Diagnosis not present

## 2016-11-12 DIAGNOSIS — Z789 Other specified health status: Secondary | ICD-10-CM | POA: Diagnosis not present

## 2016-11-12 DIAGNOSIS — Z713 Dietary counseling and surveillance: Secondary | ICD-10-CM | POA: Diagnosis not present

## 2016-11-12 DIAGNOSIS — Z6832 Body mass index (BMI) 32.0-32.9, adult: Secondary | ICD-10-CM | POA: Diagnosis not present

## 2016-11-12 DIAGNOSIS — I10 Essential (primary) hypertension: Secondary | ICD-10-CM | POA: Diagnosis not present

## 2016-11-12 DIAGNOSIS — Z299 Encounter for prophylactic measures, unspecified: Secondary | ICD-10-CM | POA: Diagnosis not present

## 2016-11-21 DIAGNOSIS — R0602 Shortness of breath: Secondary | ICD-10-CM | POA: Diagnosis not present

## 2016-11-26 DIAGNOSIS — M25569 Pain in unspecified knee: Secondary | ICD-10-CM | POA: Diagnosis not present

## 2016-11-26 DIAGNOSIS — R270 Ataxia, unspecified: Secondary | ICD-10-CM | POA: Diagnosis not present

## 2016-11-26 DIAGNOSIS — Z299 Encounter for prophylactic measures, unspecified: Secondary | ICD-10-CM | POA: Diagnosis not present

## 2016-11-26 DIAGNOSIS — I471 Supraventricular tachycardia: Secondary | ICD-10-CM | POA: Diagnosis not present

## 2016-11-26 DIAGNOSIS — Z713 Dietary counseling and surveillance: Secondary | ICD-10-CM | POA: Diagnosis not present

## 2016-11-26 DIAGNOSIS — Z6832 Body mass index (BMI) 32.0-32.9, adult: Secondary | ICD-10-CM | POA: Diagnosis not present

## 2016-11-26 DIAGNOSIS — M19049 Primary osteoarthritis, unspecified hand: Secondary | ICD-10-CM | POA: Diagnosis not present

## 2016-11-26 DIAGNOSIS — R0600 Dyspnea, unspecified: Secondary | ICD-10-CM | POA: Diagnosis not present

## 2016-11-26 DIAGNOSIS — I340 Nonrheumatic mitral (valve) insufficiency: Secondary | ICD-10-CM | POA: Diagnosis not present

## 2016-11-28 ENCOUNTER — Encounter: Payer: Self-pay | Admitting: *Deleted

## 2016-11-28 ENCOUNTER — Ambulatory Visit (INDEPENDENT_AMBULATORY_CARE_PROVIDER_SITE_OTHER): Payer: Medicare Other | Admitting: Cardiology

## 2016-11-28 ENCOUNTER — Telehealth: Payer: Self-pay | Admitting: Cardiology

## 2016-11-28 ENCOUNTER — Ambulatory Visit: Payer: Medicare Other

## 2016-11-28 ENCOUNTER — Encounter: Payer: Self-pay | Admitting: Cardiology

## 2016-11-28 VITALS — BP 133/70 | HR 75 | Ht 62.0 in | Wt 177.6 lb

## 2016-11-28 DIAGNOSIS — R0989 Other specified symptoms and signs involving the circulatory and respiratory systems: Secondary | ICD-10-CM

## 2016-11-28 DIAGNOSIS — Z01810 Encounter for preprocedural cardiovascular examination: Secondary | ICD-10-CM

## 2016-11-28 DIAGNOSIS — I447 Left bundle-branch block, unspecified: Secondary | ICD-10-CM | POA: Diagnosis not present

## 2016-11-28 DIAGNOSIS — R0600 Dyspnea, unspecified: Secondary | ICD-10-CM | POA: Diagnosis not present

## 2016-11-28 LAB — VAS US CAROTID
LCCAPSYS: 88 cm/s
LEFT ECA DIAS: -21 cm/s
LEFT VERTEBRAL DIAS: 27 cm/s
Left CCA dist dias: 29 cm/s
Left CCA dist sys: 108 cm/s
Left CCA prox dias: 17 cm/s
Left ICA dist dias: -60 cm/s
Left ICA dist sys: -173 cm/s
Left ICA prox dias: -29 cm/s
Left ICA prox sys: -112 cm/s
RCCAPDIAS: 32 cm/s
RIGHT ECA DIAS: -23 cm/s
RIGHT VERTEBRAL DIAS: -18 cm/s
Right CCA prox sys: 154 cm/s
Right cca dist sys: -184 cm/s

## 2016-11-28 NOTE — Progress Notes (Addendum)
Clinical Summary Jane [name] is a 76 y.o.female new patient referred by Dr Woody Seller for the following medical problems.   1. Preopertaive evaluation - she is being considered for knee replacement - exertion limited by chronic kneep pain.    2. LBBB - reports history 20 years of LBBB, she reports iniitally LBBB was rate related. Diagnosed in 1996, at that time rates 160-170 on treadmill stress she developed LBBB - heart cath 2005 was normal - no recent chest pain.   3. Dyspnea - episode a few weeks ago. Just got out of shower. Felt weak all over. A few minutes later later neck and bilateral shoulders, aching pain. Thought was arthitis like pain. Felt heat throughout entire chest, felt dizzy. Sat down and became diaphoretic. Took aspirin and layed down. Called 911 - EMS arrived, heart rates "racing". BP 110s. Did not go to ER. Went to Dr Manuella Ghazi the following day.    Past Medical History:  Diagnosis Date  . Arthritis   . Depression   . H/O: rheumatic fever    as child  . Heart murmur   . Hx of colonic polyps   . Hypertension      Allergies  Allergen Reactions  . Ciprofloxacin Hcl     Hand stiffness   . Clindamycin/Lincomycin     Per patient, give me c-diff  . Flagyl [Metronidazole] Hives    Questionable if this medication was the cause.  . Lodine [Etodolac] Other (See Comments)    Stomach pain   . Other     Has a very low tolerance to sedation and stimulating drugs   . Penicillins Diarrhea and Rash     Current Outpatient Prescriptions  Medication Sig Dispense Refill  . acetaminophen (TYLENOL) 500 MG tablet Take 1,000 mg by mouth as needed.    Marland Kitchen aspirin EC 81 MG tablet Take 81 mg by mouth at bedtime.    . calcium-vitamin D (OSCAL WITH D) 500-200 MG-UNIT per tablet Take 1 tablet by mouth 2 (two) times daily.    . Cholecalciferol (VITAMIN D3 PO) Take 1 capsule by mouth daily.    . citalopram (CELEXA) 20 MG tablet Take 20 mg by mouth at bedtime.    . clorazepate  (TRANXENE) 7.5 MG tablet Take 7.5 mg by mouth as needed for anxiety.    . diclofenac (CATAFLAM) 50 MG tablet Take 50 mg by mouth 2 (two) times daily.    Marland Kitchen lisinopril (PRINIVIL,ZESTRIL) 20 MG tablet Take 20 mg by mouth 2 (two) times daily.    . magnesium oxide (MAG-OX) 400 MG tablet Take 400 mg by mouth daily.    . meclizine (ANTIVERT) 25 MG tablet Take 12.5-25 mg by mouth daily as needed. .-1/2-1 tablet as needed for vertigo    . Multiple Vitamins-Minerals (MULTIVITAMINS THER. W/MINERALS) TABS Take 1 tablet by mouth daily.    . Probiotic Product (PROBIOTIC ADVANCED PO) Take by mouth. 3 x week    . simvastatin (ZOCOR) 20 MG tablet Take 20 mg by mouth at bedtime.    . TURMERIC PO Take 1 capsule by mouth 2 (two) times daily.     No current facility-administered medications for this visit.      Past Surgical History:  Procedure Laterality Date  . CARDIAC CATHETERIZATION    . COLONOSCOPY  06/03/2012   Procedure: COLONOSCOPY;  Surgeon: Rogene Houston, MD;  Location: [facility];  Service: Endoscopy;  Laterality: N/A;  1200  . FACIAL COSMETIC SURGERY    .  RHINOPLASTY    . TOE SURGERY    . TONSILLECTOMY    . TUBAL LIGATION       Allergies  Allergen Reactions  . Ciprofloxacin Hcl     Hand stiffness   . Clindamycin/Lincomycin     Per patient, give me c-diff  . Flagyl [Metronidazole] Hives    Questionable if this medication was the cause.  . Lodine [Etodolac] Other (See Comments)    Stomach pain   . Other     Has a very low tolerance to sedation and stimulating drugs   . Penicillins Diarrhea and Rash      No family history on file.   Social History Jane [name] reports that she has never smoked. She has never used smokeless tobacco. Jane [name] reports that she drinks alcohol.   Review of Systems CONSTITUTIONAL: No weight loss, fever, chills, weakness or fatigue.  HEENT: Eyes: No visual loss, blurred vision, double vision or yellow sclerae.No hearing loss, sneezing,  congestion, runny nose or sore throat.  SKIN: No rash or itching.  CARDIOVASCULAR: per HPI RESPIRATORY: No shortness of breath, cough or sputum.  GASTROINTESTINAL: No anorexia, nausea, vomiting or diarrhea. No abdominal pain or blood.  GENITOURINARY: No burning on urination, no polyuria NEUROLOGICAL: No headache, dizziness, syncope, paralysis, ataxia, numbness or tingling in the extremities. No change in bowel or bladder control.  MUSCULOSKELETAL: No muscle, back pain, joint pain or stiffness.  LYMPHATICS: No enlarged nodes. No history of splenectomy.  PSYCHIATRIC: No history of depression or anxiety.  ENDOCRINOLOGIC: No reports of sweating, cold or heat intolerance. No polyuria or polydipsia.  Marland Kitchen   Physical Examination Vitals:   11/28/16 1448 11/28/16 1502  BP: 134/74 133/70  Pulse: 69 75   Filed Weights   11/28/16 1448  Weight: 177 lb 9.6 oz (80.6 kg)    Gen: resting comfortably, no acute distress HEENT: no scleral icterus, pupils equal round and reactive, no palptable cervical adenopathy,  CV: RRR, 2/6 systolic murmur at apex, no jvd. Bilateral carotis bruits Resp: Clear to auscultation bilaterally GI: abdomen is soft, non-tender, non-distended, normal bowel sounds, no hepatosplenomegaly MSK: extremities are warm, no edema.  Skin: warm, no rash Neuro:  no focal deficits Psych: appropriate affect     Assessment and Plan  1. LBBB - initially rate related LBBB, most recent EKG shows now shows LBBB at normal heart rates - recent episode of dyspnea - given symptoms and LBBB will evaluate for underlying heart disease.  - she had recent echo with normal LVEF. We will plan for lexiscan MPI to evaluate for ischemia  2. Dyspnea - plan for lexiscan  3. Preoperative evaluation - final recs pending cardiac workup as described above  4. Carotid bruits - order carotid US  F/u pending testing results.       Arnoldo Lenis, M.D.   12/10/16 Addendum Carlton Adam nuclear  stress test is negative for ischemia. Recommend proceeding with surgery as planned.  Zandra Abts MD

## 2016-11-28 NOTE — Telephone Encounter (Signed)
Pre-cert Verification for the following procedure   Lexiscan myoview scheduled for 12/03/16 at [facility].

## 2016-11-28 NOTE — Patient Instructions (Signed)
Your physician recommends that you schedule a follow-up appointment in: TO BE DETERMINED  Your physician recommends that you continue on your current medications as directed. Please refer to the Current Medication list given to you today.  Your physician has requested that you have a carotid duplex. This test is an ultrasound of the carotid arteries in your neck. It looks at blood flow through these arteries that supply the brain with blood. Allow one hour for this exam. There are no restrictions or special instructions.

## 2016-11-29 ENCOUNTER — Telehealth: Payer: Self-pay | Admitting: Cardiology

## 2016-11-29 ENCOUNTER — Encounter: Payer: Self-pay | Admitting: *Deleted

## 2016-11-29 NOTE — Telephone Encounter (Signed)
Test reschedule to April 2 at [facility]

## 2016-11-29 NOTE — Telephone Encounter (Signed)
Returning call.  She will be home until 1045 after that call her cell phone please

## 2016-11-29 NOTE — Telephone Encounter (Signed)
PT returned call regarding lexi that was scheduled per Dr branch - pt needs to reschedule for 4/2 or 4/3 message sent to schedulers and will call pt back - verbally gave instructions for lexi and will mail pt instruction letter with new date/time

## 2016-12-03 ENCOUNTER — Encounter (HOSPITAL_COMMUNITY): Payer: Medicare Other

## 2016-12-04 NOTE — Patient Instructions (Addendum)
Jane [name]  12/04/2016   Your procedure is scheduled on: Monday 12-16-16  Report to [facility] Main  Entrance and check in at Admitting  At 1250pm  Call this number if you have problems the morning of surgery [phone]   Remember: ONLY 1 PERSON MAY GO WITH YOU TO SHORT STAY TO GET  READY MORNING OF [place].  You may eat the day prior to surgery until Midnight. After Midnight until 0700 you may have a Clear Liquid Diet. After that, nothing by mouth.      CLEAR LIQUID DIET   Foods Allowed                                                                     Foods Excluded  Coffee and tea, regular and decaf                             liquids that you cannot  Plain Jell-O in any flavor                                             see through such as: Fruit ices (not with fruit pulp)                                     milk, soups, orange juice  Iced Popsicles                                    All solid food Carbonated beverages, regular and diet                                    Cranberry, grape and apple juices Sports drinks like Gatorade Lightly seasoned clear broth or consume(fat free) Sugar, honey syrup  Sample Menu Breakfast                                Lunch                                     Supper Cranberry juice                    Beef broth                            Chicken broth Jell-O                                     Grape juice  Apple juice Coffee or tea                        Jell-O                                      Popsicle                                                Coffee or tea                        Coffee or tea  _____________________________________________________________________                                                                         _____________________________________________________________________    Take these medicines the morning of surgery with A SIP  OF WATER: Tranxene if needed                                 You may not have any metal on your body including hair pins and              piercings  Do not wear jewelry, make-up, lotions, powders or perfumes, deodorant             Do not wear nail polish.  Do not shave  48 hours prior to surgery.               Do not bring valuables to the hospital. [place].  Contacts, dentures or bridgework may not be worn into surgery.  Leave suitcase in the car. After surgery it may be brought to your room.       Special Instructions: N/A              Please read over the following fact sheets you were given: _____________________________________________________________________    Incentive Spirometer  An incentive spirometer is a tool that can help keep your lungs clear and active. This tool measures how well you are filling your lungs with each breath. Taking long deep breaths may help reverse or decrease the chance of developing breathing (pulmonary) problems (especially infection) following:  A long period of time when you are unable to move or be active. BEFORE THE PROCEDURE   If the spirometer includes an indicator to show your best effort, your nurse or respiratory therapist will set it to a desired goal.  If possible, sit up straight or lean slightly forward. Try not to slouch.  Hold the incentive spirometer in an upright position. INSTRUCTIONS FOR USE  1. Sit on the edge of your bed if possible, or sit up as far as you can in bed or on a chair. 2. Hold the incentive spirometer in an upright position. 3. Breathe out normally. 4. Place the mouthpiece in  your mouth and seal your lips tightly around it. 5. Breathe in slowly and as deeply as possible, raising the piston or the ball toward the top of the column. 6. Hold your breath for 3-5 seconds or for as long as possible. Allow the piston or ball to fall to the bottom of the  column. 7. Remove the mouthpiece from your mouth and breathe out normally. 8. Rest for a few seconds and repeat Steps 1 through 7 at least 10 times every 1-2 hours when you are awake. Take your time and take a few normal breaths between deep breaths. 9. The spirometer may include an indicator to show your best effort. Use the indicator as a goal to work toward during each repetition. 10. After each set of 10 deep breaths, practice coughing to be sure your lungs are clear. If you have an incision (the cut made at the time of surgery), support your incision when coughing by placing a pillow or rolled up towels firmly against it. Once you are able to get out of bed, walk around indoors and cough well. You may stop using the incentive spirometer when instructed by your caregiver.  RISKS AND COMPLICATIONS  Take your time so you do not get dizzy or light-headed.  If you are in pain, you may need to take or ask for pain medication before doing incentive spirometry. It is harder to take a deep breath if you are having pain. AFTER USE  Rest and breathe slowly and easily.  It can be helpful to keep track of a log of your progress. Your caregiver can provide you with a simple table to help with this. If you are using the spirometer at home, follow these instructions: [place] IF:   You are having difficultly using the spirometer.  You have trouble using the spirometer as often as instructed.  Your pain medication is not giving enough relief while using the spirometer.  You develop fever of 100.5 F (38.1 C) or higher. SEEK IMMEDIATE MEDICAL CARE IF:   You cough up bloody sputum that had not been present before.  You develop fever of 102 F (38.9 C) or greater.  You develop worsening pain at or near the incision site. MAKE SURE YOU:   Understand these instructions.  Will watch your condition.  Will get help right away if you are not doing well or get worse. Document Released:  01/06/2007 Document Revised: 11/18/2011 Document Reviewed: 03/09/2007 ExitCare Patient Information 2014 ExitCare, [place].   ________________________________________________________________________  WHAT IS A BLOOD TRANSFUSION? Blood Transfusion Information  A transfusion is the replacement of blood or some of its parts. Blood is made up of multiple cells which provide different functions.  Red blood cells carry oxygen and are used for blood loss replacement.  White blood cells fight against infection.  Platelets control bleeding.  Plasma helps clot blood.  Other blood products are available for specialized needs, such as hemophilia or other clotting disorders. BEFORE THE TRANSFUSION  Who gives blood for transfusions?   Healthy volunteers who are fully evaluated to make sure their blood is safe. This is blood bank blood. Transfusion therapy is the safest it has ever been in the practice of medicine. Before blood is taken from a donor, a complete history is taken to make sure that person has no history of diseases nor engages in risky social behavior (examples are intravenous drug use or sexual activity with multiple partners). The donor's travel history is screened to minimize risk  of transmitting infections, such as malaria. The donated blood is tested for signs of infectious diseases, such as HIV and hepatitis. The blood is then tested to be sure it is compatible with you in order to minimize the chance of a transfusion reaction. If you or a relative donates blood, this is often done in anticipation of surgery and is not appropriate for emergency situations. It takes many days to process the donated blood. RISKS AND COMPLICATIONS Although transfusion therapy is very safe and saves many lives, the main dangers of transfusion include:   Getting an infectious disease.  Developing a transfusion reaction. This is an allergic reaction to something in the blood you were given. Every precaution  is taken to prevent this. The decision to have a blood transfusion has been considered carefully by your caregiver before blood is given. Blood is not given unless the benefits outweigh the risks. AFTER THE TRANSFUSION  Right after receiving a blood transfusion, you will usually feel much better and more energetic. This is especially true if your red blood cells have gotten low (anemic). The transfusion raises the level of the red blood cells which carry oxygen, and this usually causes an energy increase.  The nurse administering the transfusion will monitor you carefully for complications. HOME CARE INSTRUCTIONS  No special instructions are needed after a transfusion. You may find your energy is better. Speak with your caregiver about any limitations on activity for underlying diseases you may have. SEEK MEDICAL CARE IF:   Your condition is not improving after your transfusion.  You develop redness or irritation at the intravenous (IV) site. SEEK IMMEDIATE MEDICAL CARE IF:  Any of the following symptoms occur over the next 12 hours:  Shaking chills.  You have a temperature by mouth above 102 F (38.9 C), not controlled by medicine.  Chest, back, or muscle pain.  People around you feel you are not acting correctly or are confused.  Shortness of breath or difficulty breathing.  Dizziness and fainting.  You get a rash or develop hives.  You have a decrease in urine output.  Your urine turns a dark color or changes to pink, red, or brown. Any of the following symptoms occur over the next 10 days:  You have a temperature by mouth above 102 F (38.9 C), not controlled by medicine.  Shortness of breath.  Weakness after normal activity.  The white part of the eye turns yellow (jaundice).  You have a decrease in the amount of urine or are urinating less often.  Your urine turns a dark color or changes to pink, red, or brown. Document Released: 08/23/2000 Document Revised:  11/18/2011 Document Reviewed: 04/11/2008 ExitCare Patient Information 2014 ExitCare, [place].  _______________________________________________________________________ WHAT IS A BLOOD TRANSFUSION? Blood Transfusion Information  A transfusion is the replacement of blood or some of its parts. Blood is made up of multiple cells which provide different functions.  Red blood cells carry oxygen and are used for blood loss replacement.  White blood cells fight against infection.  Platelets control bleeding.  Plasma helps clot blood.  Other blood products are available for specialized needs, such as hemophilia or other clotting disorders. BEFORE THE TRANSFUSION  Who gives blood for transfusions?   Healthy volunteers who are fully evaluated to make sure their blood is safe. This is blood bank blood. Transfusion therapy is the safest it has ever been in the practice of medicine. Before blood is taken from a donor, a complete history is taken to make  sure that person has no history of diseases nor engages in risky social behavior (examples are intravenous drug use or sexual activity with multiple partners). The donor's travel history is screened to minimize risk of transmitting infections, such as malaria. The donated blood is tested for signs of infectious diseases, such as HIV and hepatitis. The blood is then tested to be sure it is compatible with you in order to minimize the chance of a transfusion reaction. If you or a relative donates blood, this is often done in anticipation of surgery and is not appropriate for emergency situations. It takes many days to process the donated blood. RISKS AND COMPLICATIONS Although transfusion therapy is very safe and saves many lives, the main dangers of transfusion include:   Getting an infectious disease.  Developing a transfusion reaction. This is an allergic reaction to something in the blood you were given. Every precaution is taken to prevent this. The  decision to have a blood transfusion has been considered carefully by your caregiver before blood is given. Blood is not given unless the benefits outweigh the risks. AFTER THE TRANSFUSION  Right after receiving a blood transfusion, you will usually feel much better and more energetic. This is especially true if your red blood cells have gotten low (anemic). The transfusion raises the level of the red blood cells which carry oxygen, and this usually causes an energy increase.  The nurse administering the transfusion will monitor you carefully for complications. HOME CARE INSTRUCTIONS  No special instructions are needed after a transfusion. You may find your energy is better. Speak with your caregiver about any limitations on activity for underlying diseases you may have. SEEK MEDICAL CARE IF:   Your condition is not improving after your transfusion.  You develop redness or irritation at the intravenous (IV) site. SEEK IMMEDIATE MEDICAL CARE IF:  Any of the following symptoms occur over the next 12 hours:  Shaking chills.  You have a temperature by mouth above 102 F (38.9 C), not controlled by medicine.  Chest, back, or muscle pain.  People around you feel you are not acting correctly or are confused.  Shortness of breath or difficulty breathing.  Dizziness and fainting.  You get a rash or develop hives.  You have a decrease in urine output.  Your urine turns a dark color or changes to pink, red, or brown. Any of the following symptoms occur over the next 10 days:  You have a temperature by mouth above 102 F (38.9 C), not controlled by medicine.  Shortness of breath.  Weakness after normal activity.  The white part of the eye turns yellow (jaundice).  You have a decrease in the amount of urine or are urinating less often.  Your urine turns a dark color or changes to pink, red, or brown. Document Released: 08/23/2000 Document Revised: 11/18/2011 Document Reviewed:  04/11/2008 ExitCare Patient Information 2014 [place].  _______________________________________________________________________           [facility] - Preparing for Surgery Before surgery, you can play an important role.  Because skin is not sterile, your skin needs to be as free of germs as possible.  You can reduce the number of germs on your skin by washing with CHG (chlorahexidine gluconate) soap before surgery.  CHG is an antiseptic cleaner which kills germs and bonds with the skin to continue killing germs even after washing. Please DO NOT use if you have an allergy to CHG or antibacterial soaps.  If your skin  becomes reddened/irritated stop using the CHG and inform your nurse when you arrive at Short Stay. Do not shave (including legs and underarms) for at least 48 hours prior to the first CHG shower.  You may shave your face/neck. Please follow these instructions carefully:  1.  Shower with CHG Soap the night before surgery and the  morning of Surgery.  2.  If you choose to wash your hair, wash your hair first as usual with your  normal  shampoo.  3.  After you shampoo, rinse your hair and body thoroughly to remove the  shampoo.                           4.  Use CHG as you would any other liquid soap.  You can apply chg directly  to the skin and wash                       Gently with a scrungie or clean washcloth.  5.  Apply the CHG Soap to your body ONLY FROM THE NECK DOWN.   Do not use on face/ open                           Wound or open sores. Avoid contact with eyes, ears mouth and genitals (private parts).                       Wash face,  Genitals (private parts) with your normal soap.             6.  Wash thoroughly, paying special attention to the area where your surgery  will be performed.  7.  Thoroughly rinse your body with warm water from the neck down.  8.  DO NOT shower/wash with your normal soap after using and rinsing off  the CHG Soap.                9.  Pat  yourself dry with a clean towel.            10.  Wear clean pajamas.            11.  Place clean sheets on your bed the night of your first shower and do not  sleep with pets. Day of Surgery : Do not apply any lotions/deodorants the morning of surgery.  Please wear clean clothes to the hospital/surgery center.  FAILURE TO FOLLOW THESE INSTRUCTIONS MAY RESULT IN THE CANCELLATION OF YOUR SURGERY PATIENT SIGNATURE_________________________________  NURSE SIGNATURE__________________________________  ________________________________________________________________________

## 2016-12-05 ENCOUNTER — Ambulatory Visit: Payer: Self-pay | Admitting: Orthopedic Surgery

## 2016-12-06 ENCOUNTER — Ambulatory Visit: Payer: Self-pay | Admitting: Orthopedic Surgery

## 2016-12-06 NOTE — H&P (Signed)
Jane [name] DOB: 06-13-1941 Divorced / Language: [place] / Race: White Female Date of Admission:  12/16/2016 CC:  Left knee Pain History of Present Illness The patient is a 76 year old female who comes in for a preoperative History and Physical. The patient is scheduled for a left total knee arthroplasty to be performed by Dr. Gus Rankin. Aluisio, MD at [facility] on 12-16-2016. The patient is a 76 year old female who presented with knee complaints. The patient was seen in referral from Dr. Darrelyn Hillock. The patient reports left knee symptoms including: pain which began month(s) (since surgery) ago following a specific injury. The patient describes the severity of the symptoms as severe (with activity).The patient feels that the symptoms are worsening. Note for "Knee pain": The patient does have a history of a left knee scope by Dr. Darrelyn Hillock on 04-10-2016. She is unable to describe her pain. She has tried a cortisone injection, physical therapy and a Land. She has been taking voltaren and tylenol for pain. She has been trying to stay off her feet and has stopped doing her exercises due to the pain. She has been wearing a knee brace and that seems to help. Unfortunately, Jane [name]'s left knee has gotten progressively worse since she had the arthroscopy back in August. She is now hurting at all times. It is limiting what she can and cannot do. She has had cortisone injections. Unfortunately, she has pain day and night as well as at rest. She feels like the knee is essentially taken over her life. She would like a solution to this, so she could be more active. She is ready to proceed with surgery. They have been treated conservatively in the past for the above stated problem and despite conservative measures, they continue to have progressive pain and severe functional limitations and dysfunction. They have failed non-operative management including home exercise, medications, and injections. It is  felt that they would benefit from undergoing total joint replacement. Risks and benefits of the procedure have been discussed with the patient and they elect to proceed with surgery. There are no active contraindications to surgery such as ongoing infection or rapidly progressive neurological disease.  Problem List/Past Medical Primary localized osteoarthritis of left knee (M17.12)  Chronic pain of left knee (M25.562)  High blood pressure  Hypercholesterolemia  Allergic Urticaria  Heart murmur  Osteoarthritis  Vertigo  Shingles  Depression  Cataract  Left Bundle Branch Block  Rheumatic Fever  Childhood Illness Hemorrhoids  Menopause   Allergies Very sensitive to sedatives and very sensitive to stimulants Cipro *FLUOROQUINOLONES*  mulitple joint pain Flagyl *Anti-infective Agents - Misc.**  Hives. Penicillins  Rash, Diarrhea. Etodolac *ANALGESICS - ANTI-INFLAMMATORY*  stomach upset Clindamycin HCl *Anti-infective Agents - Misc.**  caused C.Diff Levaquin *FLUOROQUINOLONES*  does not remember RXN  Family History Cancer  Mother. Chronic Obstructive Lung Disease  Mother, Sister. Depression  Mother. Diabetes Mellitus  Sister. Heart Disease  Father, Maternal Grandfather, Paternal Grandfather. Hypertension  Father, Mother, Sister. Osteoarthritis  Mother. Rheumatoid Arthritis  Maternal Grandmother.  Social History  Children  2 Current drinker  03/13/2016: Currently drinks wine only occasionally per week Current work status  retired Scientist, physiological weekly; does running / walking Living situation  live alone Marital status  divorced No history of drug/alcohol rehab  Not under pain contract  Number of flights of stairs before winded  2-3 Tobacco / smoke exposure  03/13/2016: yes Tobacco use  Never smoker. 03/13/2016: uses less than 1/2 can(s)  smokeless per week Advance Directives  Living Will, Healthcare POA  Medication  History Diclofenac (75MG  Tablet, Oral) Active. Meclizine HCl (25MG  Tablet, Oral) Active. (as needed for Vertigo) Citalopram Hydrobromide (20MG  Tablet, Oral) Active. Simvastatin (20MG  Tablet, Oral) Active. Lisinopril (20MG  Tablet, Oral) Active. Tylenol (prn) Active. Multiple Vitamin (1 (one) Oral) Active. Calcium Citrate (200MG  Tablet, 1 (one) Oral) Active. Magnesium Active. Vit D Active. Turmeric Active. Aspirin (81MG  Tablet Chewable, Oral) Active.   Past Surgical History  Tonsillectomy  Date: 43. Right Leg Skin Graft  Date: 1969. Tubal Ligation  Date: 70. Rhinoplasty  Date: 41. Cataract Surgery  right Colon Polyp Removal - Colonoscopy  Leg Circulation Surgery  bilateral  Review of Systems General Not Present- Chills, Fatigue, Fever, Memory Loss, Night Sweats, Weight Gain and Weight Loss. Skin Not Present- Eczema, Hives, Itching, Lesions and Rash. HEENT Not Present- Dentures, Double Vision, Headache, Hearing Loss, Tinnitus and Visual Loss. Respiratory Present- Shortness of breath with exertion. Not Present- Allergies, Chronic Cough, Coughing up blood and Shortness of breath at rest. Cardiovascular Present- Murmur. Not Present- Chest Pain, Difficulty Breathing Lying Down, Palpitations, Racing/skipping heartbeats and Swelling. Gastrointestinal Not Present- Abdominal Pain, Bloody Stool, Constipation, Diarrhea, Difficulty Swallowing, Heartburn, Jaundice, Loss of appetitie, Nausea and Vomiting. Female Genitourinary Not Present- Blood in Urine, Discharge, Flank Pain, Incontinence, Painful Urination, Urgency, Urinary frequency, Urinary Retention, Urinating at Night and Weak urinary stream. Musculoskeletal Present- Morning Stiffness and Muscle Weakness. Not Present- Back Pain, Joint Pain, Joint Swelling, Muscle Pain and Spasms. Neurological Not Present- Blackout spells, Difficulty with balance, Dizziness, Paralysis, Tremor and Weakness. Psychiatric Not Present-  Insomnia.  Vitals  Weight: 170 lb Height: 62in Weight was reported by patient. Height was reported by patient. Body Surface Area: 1.78 m Body Mass Index: 31.09 kg/m  Pulse: 76 (Regular)  BP: 132/74 (Sitting, Right Arm, Standard   Physical Exam General Mental Status -Alert, cooperative and good historian. General Appearance-pleasant, Not in acute distress. Orientation-Oriented X3. Build & Nutrition-Well nourished and Well developed.  Head and Neck Head-normocephalic, atraumatic . Neck Global Assessment - bruit auscultated on the left(very faint), supple, no bruit auscultated on the right.  Eye Pupil - Bilateral-Regular and Round. Motion - Bilateral-EOMI.  Chest and Lung Exam Auscultation Breath sounds - clear at anterior chest wall and clear at posterior chest wall. Adventitious sounds - No Adventitious sounds.  Cardiovascular Auscultation Rhythm - Regular rate and rhythm. Heart Sounds - S1 WNL and S2 WNL. Murmurs & Other Heart Sounds - Auscultation of the heart reveals - No Murmurs.  Abdomen Palpation/Percussion Tenderness - Abdomen is non-tender to palpation. Rigidity (guarding) - Abdomen is soft. Auscultation Auscultation of the abdomen reveals - Bowel sounds normal.  Female Genitourinary Note: Not done, not pertinent to present illness   Musculoskeletal Note: On exam, she is in no distress. Evaluation of her left hip shows normal range of motion with no discomfort. Her left knee shows no effusion. Her range is about 5 to 120. There is marked crepitus on range of motion with tenderness medial greater than lateral and no instability. Right knee range 0 to 130 with no tenderness or instability.  RADIOGRAPHS From Dr. Fatima Sanger office from about four months ago showed she has got bone on bone arthritis medial patellofemoral compartments and almost bone on bone lateral. I reviewed some of her scope pictures. It showed exposed bone in the  medial and lateral compartments.  Assessment & Plan  Primary localized osteoarthritis of left knee (M17.12)  Note:Surgical Plans: Left Total Knee Replacement  Disposition: Home with caregiver, Straight to outpatient therapy at [facility] in [place], [place] on Friday 12/20/2016.  PCP: Dr. Sherril Croon - pending preop workup  IV TXA  Anesthesia Issues: None  Patient was instructed on what medications to stop prior to surgery.  Signed electronically by Lauraine Rinne, III PA-C

## 2016-12-09 ENCOUNTER — Ambulatory Visit: Payer: Medicare Other | Admitting: Cardiology

## 2016-12-09 ENCOUNTER — Encounter (HOSPITAL_COMMUNITY)
Admission: RE | Admit: 2016-12-09 | Discharge: 2016-12-09 | Disposition: A | Discharge status: Home / Self Care | Payer: Medicare Other | Source: Ambulatory Visit | Attending: Cardiology | Admitting: Cardiology

## 2016-12-09 ENCOUNTER — Encounter (HOSPITAL_COMMUNITY): Payer: Self-pay

## 2016-12-09 ENCOUNTER — Telehealth: Payer: Self-pay | Admitting: *Deleted

## 2016-12-09 ENCOUNTER — Inpatient Hospital Stay (HOSPITAL_COMMUNITY): Admission: RE | Admit: 2016-12-09 | Payer: Medicare Other | Source: Ambulatory Visit

## 2016-12-09 DIAGNOSIS — R0600 Dyspnea, unspecified: Secondary | ICD-10-CM

## 2016-12-09 LAB — NM MYOCAR MULTI W/SPECT W/WALL MOTION / EF
CHL CUP NUCLEAR SDS: 13
CHL CUP NUCLEAR SRS: 8
CHL CUP RESTING HR STRESS: 78 {beats}/min
CSEPPHR: 111 {beats}/min
LV dias vol: 60 mL (ref 46–106)
LV sys vol: 28 mL
NUC STRESS TID: 1.06
RATE: 0
SSS: 21

## 2016-12-09 MED ORDER — TECHNETIUM TC 99M TETROFOSMIN IV KIT
30.0000 | PACK | Freq: Once | INTRAVENOUS | Status: AC | PRN
  Administered 2016-12-09: 30 via INTRAVENOUS

## 2016-12-09 MED ORDER — REGADENOSON 0.4 MG/5ML IV SOLN
INTRAVENOUS | Status: AC
  Administered 2016-12-09: 0.4 mg via INTRAVENOUS
  Filled 2016-12-09: qty 5

## 2016-12-09 MED ORDER — SODIUM CHLORIDE 0.9% FLUSH
INTRAVENOUS | Status: AC
  Administered 2016-12-09: 10 mL via INTRAVENOUS
  Filled 2016-12-09: qty 10

## 2016-12-09 MED ORDER — TECHNETIUM TC 99M TETROFOSMIN IV KIT
10.0000 | PACK | Freq: Once | INTRAVENOUS | Status: AC | PRN
  Administered 2016-12-09: 10 via INTRAVENOUS

## 2016-12-09 NOTE — Telephone Encounter (Signed)
Notes recorded by Laurine Blazer, LPN on 10/15/2033 at 5:97 AM EDT Patient notified. Copy to pmd. ------  Notes recorded by Massie Maroon, CMA on 12/04/2016 at 3:31 PM EDT LM to return call ------  Notes recorded by Massie Maroon, CMA on 12/03/2016 at 11:51 AM EDT LM for pt to return call ------  Notes recorded by Arnoldo Lenis, MD on 12/03/2016 at 10:39 AM EDT Only very mild plaque in arteries of neck, no significant blockages

## 2016-12-10 ENCOUNTER — Telehealth: Payer: Self-pay | Admitting: *Deleted

## 2016-12-10 NOTE — Telephone Encounter (Signed)
-----   Message from Arnoldo Lenis, MD sent at 12/10/2016  3:06 PM EDT ----- Normal stress test, she is ok for surgery from heart standpoint. Please forward my addended clinic not to surgeon.  Zandra Abts MD

## 2016-12-10 NOTE — Telephone Encounter (Signed)
Pt aware - routed to Dr Maureen Ralphs and Woody Seller

## 2016-12-11 ENCOUNTER — Encounter (HOSPITAL_COMMUNITY): Payer: Self-pay

## 2016-12-11 ENCOUNTER — Encounter (HOSPITAL_COMMUNITY)
Admission: RE | Admit: 2016-12-11 | Discharge: 2016-12-11 | Disposition: A | Discharge status: Home / Self Care | Payer: Medicare Other | Source: Ambulatory Visit | Attending: Orthopedic Surgery | Admitting: Orthopedic Surgery

## 2016-12-11 DIAGNOSIS — M1712 Unilateral primary osteoarthritis, left knee: Secondary | ICD-10-CM | POA: Diagnosis not present

## 2016-12-11 DIAGNOSIS — Z01812 Encounter for preprocedural laboratory examination: Secondary | ICD-10-CM | POA: Insufficient documentation

## 2016-12-11 LAB — COMPREHENSIVE METABOLIC PANEL
ALT: 22 U/L (ref 14–54)
ANION GAP: 6 (ref 5–15)
AST: 22 U/L (ref 15–41)
Albumin: 4.5 g/dL (ref 3.5–5.0)
Alkaline Phosphatase: 58 U/L (ref 38–126)
BUN: 24 mg/dL — ABNORMAL HIGH (ref 6–20)
CALCIUM: 9.1 mg/dL (ref 8.9–10.3)
CHLORIDE: 102 mmol/L (ref 101–111)
CO2: 25 mmol/L (ref 22–32)
Creatinine, Ser: 0.79 mg/dL (ref 0.44–1.00)
GFR calc Af Amer: >60 mL/min (ref >60)
GFR calc non Af Amer: >60 mL/min (ref >60)
Glucose, Bld: 93 mg/dL (ref 65–99)
Potassium: 4.4 mmol/L (ref 3.5–5.1)
SODIUM: 133 mmol/L — AB (ref 135–145)
Total Bilirubin: 0.6 mg/dL (ref 0.3–1.2)
Total Protein: 7.2 g/dL (ref 6.5–8.1)

## 2016-12-11 LAB — CBC
HCT: 36.8 % (ref 36.0–46.0)
Hemoglobin: 12.5 g/dL (ref 12.0–15.0)
MCH: 29.8 pg (ref 26.0–34.0)
MCHC: 34 g/dL (ref 30.0–36.0)
MCV: 87.8 fL (ref 78.0–100.0)
PLATELETS: 263 10*3/uL (ref 150–400)
RBC: 4.19 MIL/uL (ref 3.87–5.11)
RDW: 12.6 % (ref 11.5–15.5)
WBC: 7.2 10*3/uL (ref 4.0–10.5)

## 2016-12-11 LAB — PROTIME-INR
INR: 0.96
Prothrombin Time: 12.8 seconds (ref 11.4–15.2)

## 2016-12-11 LAB — SURGICAL PCR SCREEN
MRSA, PCR: NEGATIVE
Staphylococcus aureus: POSITIVE — AB

## 2016-12-11 LAB — ABO/RH: ABO/RH(D): O POS

## 2016-12-11 LAB — APTT: aPTT: 41 seconds — ABNORMAL HIGH (ref 24–36)

## 2016-12-11 NOTE — Progress Notes (Signed)
EKG-11/12/16-epic  11/21/16- [facility]- 11/28/2016- epic  Stress Test - 12/09/2016- epic

## 2016-12-11 NOTE — Progress Notes (Signed)
CMP and APTT done on 12-11-16 routed to Dr Wynelle Link for review.

## 2016-12-12 NOTE — Progress Notes (Signed)
Pt called to indicated that she received the voice message to pick up her Mupirocin from the pharmacy, and that she started the medication last night.

## 2016-12-13 NOTE — Progress Notes (Signed)
Patient called on 12/13/2016 at 1200noon and needed to ask questions. .  Patient aware to only take Tranxene only if needed am of surgery since patient needed to review medications to take am of surgery.  Patient voiced understanding.

## 2016-12-16 ENCOUNTER — Inpatient Hospital Stay (HOSPITAL_COMMUNITY): Payer: Medicare Other | Admitting: Anesthesiology

## 2016-12-16 ENCOUNTER — Encounter (HOSPITAL_COMMUNITY)
Admission: RE | Disposition: A | Discharge status: Home / Self Care | Payer: Self-pay | Source: Ambulatory Visit | Attending: Orthopedic Surgery

## 2016-12-16 ENCOUNTER — Encounter (HOSPITAL_COMMUNITY): Payer: Self-pay | Admitting: *Deleted

## 2016-12-16 ENCOUNTER — Inpatient Hospital Stay (HOSPITAL_COMMUNITY)
Admission: RE | Admit: 2016-12-16 | Discharge: 2016-12-18 | DRG: 470 | Disposition: A | Discharge status: Home / Self Care | Payer: Medicare Other | Source: Ambulatory Visit | Attending: Orthopedic Surgery | Admitting: Orthopedic Surgery

## 2016-12-16 DIAGNOSIS — Z7982 Long term (current) use of aspirin: Secondary | ICD-10-CM | POA: Diagnosis not present

## 2016-12-16 DIAGNOSIS — F17290 Nicotine dependence, other tobacco product, uncomplicated: Secondary | ICD-10-CM | POA: Diagnosis present

## 2016-12-16 DIAGNOSIS — M1712 Unilateral primary osteoarthritis, left knee: Secondary | ICD-10-CM | POA: Diagnosis present

## 2016-12-16 DIAGNOSIS — Z886 Allergy status to analgesic agent status: Secondary | ICD-10-CM

## 2016-12-16 DIAGNOSIS — Z8249 Family history of ischemic heart disease and other diseases of the circulatory system: Secondary | ICD-10-CM | POA: Diagnosis not present

## 2016-12-16 DIAGNOSIS — Z7722 Contact with and (suspected) exposure to environmental tobacco smoke (acute) (chronic): Secondary | ICD-10-CM | POA: Diagnosis present

## 2016-12-16 DIAGNOSIS — I10 Essential (primary) hypertension: Secondary | ICD-10-CM | POA: Diagnosis present

## 2016-12-16 DIAGNOSIS — Z79899 Other long term (current) drug therapy: Secondary | ICD-10-CM | POA: Diagnosis not present

## 2016-12-16 DIAGNOSIS — Z881 Allergy status to other antibiotic agents status: Secondary | ICD-10-CM | POA: Diagnosis not present

## 2016-12-16 DIAGNOSIS — Z791 Long term (current) use of non-steroidal anti-inflammatories (NSAID): Secondary | ICD-10-CM

## 2016-12-16 DIAGNOSIS — M179 Osteoarthritis of knee, unspecified: Secondary | ICD-10-CM | POA: Diagnosis present

## 2016-12-16 DIAGNOSIS — M1710 Unilateral primary osteoarthritis, unspecified knee: Secondary | ICD-10-CM | POA: Diagnosis present

## 2016-12-16 DIAGNOSIS — Z880 Allergy status to penicillin: Secondary | ICD-10-CM | POA: Diagnosis not present

## 2016-12-16 DIAGNOSIS — G8918 Other acute postprocedural pain: Secondary | ICD-10-CM | POA: Diagnosis not present

## 2016-12-16 HISTORY — PX: TOTAL KNEE ARTHROPLASTY: SHX125

## 2016-12-16 LAB — TYPE AND SCREEN
ABO/RH(D): O POS
Antibody Screen: NEGATIVE

## 2016-12-16 SURGERY — ARTHROPLASTY, KNEE, TOTAL
Anesthesia: Spinal | Site: Knee | Laterality: Left

## 2016-12-16 MED ORDER — MAGNESIUM OXIDE 400 (241.3 MG) MG PO TABS
400.0000 mg | ORAL_TABLET | Freq: Every day | ORAL | Status: DC
  Administered 2016-12-17 – 2016-12-18 (×2): 400 mg via ORAL
  Filled 2016-12-16 (×2): qty 1

## 2016-12-16 MED ORDER — BUPIVACAINE LIPOSOME 1.3 % IJ SUSP
20.0000 mL | Freq: Once | INTRAMUSCULAR | Status: DC
  Filled 2016-12-16: qty 20

## 2016-12-16 MED ORDER — BUPIVACAINE HCL (PF) 0.5 % IJ SOLN
INTRAMUSCULAR | Status: DC | PRN
  Administered 2016-12-16: 2.5 mL

## 2016-12-16 MED ORDER — POLYETHYLENE GLYCOL 3350 17 G PO PACK: 17.0000 g | PACK | Freq: Every day | ORAL | Status: DC | PRN

## 2016-12-16 MED ORDER — MECLIZINE HCL 12.5 MG PO TABS
12.5000 mg | ORAL_TABLET | Freq: Every day | ORAL | Status: DC | PRN
  Filled 2016-12-16: qty 2

## 2016-12-16 MED ORDER — HYDROMORPHONE HCL 1 MG/ML IJ SOLN: 0.2500 mg | INTRAMUSCULAR | Status: DC | PRN

## 2016-12-16 MED ORDER — CEFAZOLIN SODIUM-DEXTROSE 2-4 GM/100ML-% IV SOLN
2.0000 g | INTRAVENOUS | Status: AC
  Administered 2016-12-16: 2 g via INTRAVENOUS

## 2016-12-16 MED ORDER — ONDANSETRON HCL 4 MG PO TABS: 4.0000 mg | ORAL_TABLET | Freq: Four times a day (QID) | ORAL | Status: DC | PRN

## 2016-12-16 MED ORDER — ACETAMINOPHEN 10 MG/ML IV SOLN
INTRAVENOUS | Status: AC
  Filled 2016-12-16: qty 100

## 2016-12-16 MED ORDER — SODIUM CHLORIDE 0.9 % IJ SOLN
INTRAMUSCULAR | Status: DC | PRN
  Administered 2016-12-16: 30 mL

## 2016-12-16 MED ORDER — FLEET ENEMA 7-19 GM/118ML RE ENEM: 1.0000 | ENEMA | Freq: Once | RECTAL | Status: DC | PRN

## 2016-12-16 MED ORDER — CITALOPRAM HYDROBROMIDE 20 MG PO TABS
20.0000 mg | ORAL_TABLET | Freq: Every day | ORAL | Status: DC
  Administered 2016-12-16 – 2016-12-17 (×2): 20 mg via ORAL
  Filled 2016-12-16 (×2): qty 1

## 2016-12-16 MED ORDER — METHOCARBAMOL 1000 MG/10ML IJ SOLN
500.0000 mg | Freq: Four times a day (QID) | INTRAVENOUS | Status: DC | PRN
  Filled 2016-12-16: qty 5

## 2016-12-16 MED ORDER — FENTANYL CITRATE (PF) 100 MCG/2ML IJ SOLN
100.0000 ug | Freq: Once | INTRAMUSCULAR | Status: AC
  Administered 2016-12-16: 100 ug via INTRAVENOUS

## 2016-12-16 MED ORDER — MENTHOL 3 MG MT LOZG
1.0000 | LOZENGE | OROMUCOSAL | Status: DC | PRN
  Filled 2016-12-16: qty 9

## 2016-12-16 MED ORDER — METOCLOPRAMIDE HCL 5 MG PO TABS: 5.0000 mg | ORAL_TABLET | Freq: Three times a day (TID) | ORAL | Status: DC | PRN

## 2016-12-16 MED ORDER — ROPIVACAINE HCL 5 MG/ML IJ SOLN
INTRAMUSCULAR | Status: DC | PRN
  Administered 2016-12-16: 30 mL via PERINEURAL

## 2016-12-16 MED ORDER — PHENOL 1.4 % MT LIQD
1.0000 | OROMUCOSAL | Status: DC | PRN
Start: 2016-12-16 — End: 2016-12-18

## 2016-12-16 MED ORDER — DEXAMETHASONE SODIUM PHOSPHATE 10 MG/ML IJ SOLN
INTRAMUSCULAR | Status: AC
  Filled 2016-12-16: qty 1

## 2016-12-16 MED ORDER — BUPIVACAINE HCL (PF) 0.5 % IJ SOLN
INTRAMUSCULAR | Status: AC
  Filled 2016-12-16: qty 30

## 2016-12-16 MED ORDER — DEXAMETHASONE SODIUM PHOSPHATE 10 MG/ML IJ SOLN
10.0000 mg | Freq: Once | INTRAMUSCULAR | Status: AC
  Administered 2016-12-16: 10 mg via INTRAVENOUS

## 2016-12-16 MED ORDER — ONDANSETRON HCL 4 MG/2ML IJ SOLN
INTRAMUSCULAR | Status: DC | PRN
  Administered 2016-12-16: 4 mg via INTRAVENOUS

## 2016-12-16 MED ORDER — METOCLOPRAMIDE HCL 5 MG/ML IJ SOLN: 5.0000 mg | Freq: Three times a day (TID) | INTRAMUSCULAR | Status: DC | PRN

## 2016-12-16 MED ORDER — FENTANYL CITRATE (PF) 100 MCG/2ML IJ SOLN
INTRAMUSCULAR | Status: AC
  Filled 2016-12-16: qty 2

## 2016-12-16 MED ORDER — LACTATED RINGERS IV SOLN
INTRAVENOUS | Status: DC
  Administered 2016-12-16 (×2): via INTRAVENOUS

## 2016-12-16 MED ORDER — DIPHENHYDRAMINE HCL 12.5 MG/5ML PO ELIX: 12.5000 mg | ORAL_SOLUTION | ORAL | Status: DC | PRN

## 2016-12-16 MED ORDER — BISACODYL 10 MG RE SUPP: 10.0000 mg | Freq: Every day | RECTAL | Status: DC | PRN

## 2016-12-16 MED ORDER — DEXAMETHASONE SODIUM PHOSPHATE 10 MG/ML IJ SOLN
10.0000 mg | Freq: Once | INTRAMUSCULAR | Status: AC
  Administered 2016-12-17: 10 mg via INTRAVENOUS
  Filled 2016-12-16: qty 1

## 2016-12-16 MED ORDER — SODIUM CHLORIDE 0.9 % IJ SOLN
INTRAMUSCULAR | Status: AC
  Filled 2016-12-16: qty 50

## 2016-12-16 MED ORDER — CLORAZEPATE DIPOTASSIUM 3.75 MG PO TABS: 3.7500 mg | ORAL_TABLET | Freq: Every day | ORAL | Status: DC | PRN

## 2016-12-16 MED ORDER — EPHEDRINE 5 MG/ML INJ
INTRAVENOUS | Status: AC
  Filled 2016-12-16: qty 10

## 2016-12-16 MED ORDER — SODIUM CHLORIDE 0.9 % IV SOLN
INTRAVENOUS | Status: DC
  Administered 2016-12-16: 21:00:00 via INTRAVENOUS

## 2016-12-16 MED ORDER — GABAPENTIN 300 MG PO CAPS
ORAL_CAPSULE | ORAL | Status: AC
  Filled 2016-12-16: qty 1

## 2016-12-16 MED ORDER — METHOCARBAMOL 500 MG PO TABS
500.0000 mg | ORAL_TABLET | Freq: Four times a day (QID) | ORAL | Status: DC | PRN
  Administered 2016-12-17 – 2016-12-18 (×5): 500 mg via ORAL
  Filled 2016-12-16 (×5): qty 1

## 2016-12-16 MED ORDER — PROPOFOL 10 MG/ML IV BOLUS
INTRAVENOUS | Status: AC
  Filled 2016-12-16: qty 20

## 2016-12-16 MED ORDER — DOCUSATE SODIUM 100 MG PO CAPS
100.0000 mg | ORAL_CAPSULE | Freq: Two times a day (BID) | ORAL | Status: DC
  Administered 2016-12-16 – 2016-12-18 (×4): 100 mg via ORAL
  Filled 2016-12-16 (×4): qty 1

## 2016-12-16 MED ORDER — ONDANSETRON HCL 4 MG/2ML IJ SOLN
INTRAMUSCULAR | Status: AC
  Filled 2016-12-16: qty 2

## 2016-12-16 MED ORDER — SODIUM CHLORIDE 0.9 % IR SOLN
Status: DC | PRN
  Administered 2016-12-16: 1000 mL

## 2016-12-16 MED ORDER — GABAPENTIN 300 MG PO CAPS
300.0000 mg | ORAL_CAPSULE | Freq: Once | ORAL | Status: AC
  Administered 2016-12-16: 300 mg via ORAL

## 2016-12-16 MED ORDER — OXYCODONE HCL 5 MG PO TABS
5.0000 mg | ORAL_TABLET | ORAL | Status: DC | PRN
  Administered 2016-12-16 – 2016-12-17 (×6): 10 mg via ORAL
  Administered 2016-12-17: 03:00:00 5 mg via ORAL
  Administered 2016-12-17 – 2016-12-18 (×5): 10 mg via ORAL
  Filled 2016-12-16 (×7): qty 2
  Filled 2016-12-16: qty 1
  Filled 2016-12-16 (×4): qty 2

## 2016-12-16 MED ORDER — RIVAROXABAN 10 MG PO TABS
10.0000 mg | ORAL_TABLET | Freq: Every day | ORAL | Status: DC
  Administered 2016-12-17 – 2016-12-18 (×2): 10 mg via ORAL
  Filled 2016-12-16 (×2): qty 1

## 2016-12-16 MED ORDER — ACETAMINOPHEN 650 MG RE SUPP: 650.0000 mg | Freq: Four times a day (QID) | RECTAL | Status: DC | PRN

## 2016-12-16 MED ORDER — PROPOFOL 500 MG/50ML IV EMUL
INTRAVENOUS | Status: DC | PRN
  Administered 2016-12-16: 50 ug/kg/min via INTRAVENOUS

## 2016-12-16 MED ORDER — ACETAMINOPHEN 500 MG PO TABS
1000.0000 mg | ORAL_TABLET | Freq: Four times a day (QID) | ORAL | Status: AC
  Administered 2016-12-16 – 2016-12-17 (×4): 1000 mg via ORAL
  Filled 2016-12-16 (×4): qty 2

## 2016-12-16 MED ORDER — CEFAZOLIN SODIUM-DEXTROSE 2-4 GM/100ML-% IV SOLN
2.0000 g | Freq: Four times a day (QID) | INTRAVENOUS | Status: AC
  Administered 2016-12-16 – 2016-12-17 (×2): 2 g via INTRAVENOUS
  Filled 2016-12-16 (×2): qty 100

## 2016-12-16 MED ORDER — LIDOCAINE 2% (20 MG/ML) 5 ML SYRINGE
INTRAMUSCULAR | Status: AC
  Filled 2016-12-16: qty 5

## 2016-12-16 MED ORDER — SIMVASTATIN 20 MG PO TABS
20.0000 mg | ORAL_TABLET | Freq: Every day | ORAL | Status: DC
  Administered 2016-12-16 – 2016-12-17 (×2): 20 mg via ORAL
  Filled 2016-12-16 (×2): qty 1

## 2016-12-16 MED ORDER — CHLORHEXIDINE GLUCONATE 4 % EX LIQD: 60.0000 mL | Freq: Once | CUTANEOUS | Status: DC

## 2016-12-16 MED ORDER — ONDANSETRON HCL 4 MG/2ML IJ SOLN: 4.0000 mg | Freq: Four times a day (QID) | INTRAMUSCULAR | Status: DC | PRN

## 2016-12-16 MED ORDER — CEFAZOLIN SODIUM-DEXTROSE 2-4 GM/100ML-% IV SOLN
INTRAVENOUS | Status: AC
  Filled 2016-12-16: qty 100

## 2016-12-16 MED ORDER — PROMETHAZINE HCL 25 MG/ML IJ SOLN: 6.2500 mg | INTRAMUSCULAR | Status: DC | PRN

## 2016-12-16 MED ORDER — SUGAMMADEX SODIUM 200 MG/2ML IV SOLN
INTRAVENOUS | Status: AC
  Filled 2016-12-16: qty 2

## 2016-12-16 MED ORDER — BUPIVACAINE LIPOSOME 1.3 % IJ SUSP
INTRAMUSCULAR | Status: DC | PRN
  Administered 2016-12-16: 20 mL

## 2016-12-16 MED ORDER — ACETAMINOPHEN 10 MG/ML IV SOLN
1000.0000 mg | Freq: Once | INTRAVENOUS | Status: AC
  Administered 2016-12-16: 1000 mg via INTRAVENOUS

## 2016-12-16 MED ORDER — ACETAMINOPHEN 325 MG PO TABS
650.0000 mg | ORAL_TABLET | Freq: Four times a day (QID) | ORAL | Status: DC | PRN
  Administered 2016-12-18: 12:00:00 650 mg via ORAL
  Filled 2016-12-16 (×2): qty 2

## 2016-12-16 MED ORDER — LIDOCAINE 2% (20 MG/ML) 5 ML SYRINGE
INTRAMUSCULAR | Status: DC | PRN
  Administered 2016-12-16: 50 mg via INTRAVENOUS

## 2016-12-16 MED ORDER — EPHEDRINE SULFATE 50 MG/ML IJ SOLN
INTRAMUSCULAR | Status: DC | PRN
  Administered 2016-12-16: 10 mg via INTRAVENOUS

## 2016-12-16 MED ORDER — MORPHINE SULFATE (PF) 2 MG/ML IV SOLN
1.0000 mg | INTRAVENOUS | Status: DC | PRN
  Administered 2016-12-17 (×2): 1 mg via INTRAVENOUS
  Filled 2016-12-16 (×2): qty 1

## 2016-12-16 MED ORDER — SODIUM CHLORIDE 0.9 % IV SOLN
1000.0000 mg | INTRAVENOUS | Status: AC
  Administered 2016-12-16: 1000 mg via INTRAVENOUS
  Filled 2016-12-16: qty 1100

## 2016-12-16 SURGICAL SUPPLY — 52 items
BAG DECANTER FOR FLEXI CONT (MISCELLANEOUS) ×3 IMPLANT
BAG ZIPLOCK 12X15 (MISCELLANEOUS) ×3 IMPLANT
BANDAGE ACE 6X5 VEL STRL LF (GAUZE/BANDAGES/DRESSINGS) ×3 IMPLANT
BANDAGE ELASTIC 6 VELCRO ST LF (GAUZE/BANDAGES/DRESSINGS) ×3 IMPLANT
BLADE SAG 18X100X1.27 (BLADE) ×3 IMPLANT
BLADE SAW SGTL 11.0X1.19X90.0M (BLADE) ×3 IMPLANT
BOWL SMART MIX CTS (DISPOSABLE) ×3 IMPLANT
CAPT KNEE TOTAL 3 ATTUNE ×3 IMPLANT
CEMENT HV SMART SET (Cement) ×6 IMPLANT
CLOSURE WOUND 1/2 X4 (GAUZE/BANDAGES/DRESSINGS) ×1
COVER SURGICAL LIGHT HANDLE (MISCELLANEOUS) ×3 IMPLANT
CUFF TOURN SGL QUICK 34 (TOURNIQUET CUFF) ×2
CUFF TRNQT CYL 34X4X40X1 (TOURNIQUET CUFF) ×1 IMPLANT
DECANTER SPIKE VIAL GLASS SM (MISCELLANEOUS) ×3 IMPLANT
DRAPE U-SHAPE 47X51 STRL (DRAPES) ×3 IMPLANT
DRSG ADAPTIC 3X8 NADH LF (GAUZE/BANDAGES/DRESSINGS) ×3 IMPLANT
DRSG PAD ABDOMINAL 8X10 ST (GAUZE/BANDAGES/DRESSINGS) ×3 IMPLANT
DURAPREP 26ML APPLICATOR (WOUND CARE) ×3 IMPLANT
ELECT REM PT RETURN 15FT ADLT (MISCELLANEOUS) ×3 IMPLANT
EVACUATOR 1/8 PVC DRAIN (DRAIN) ×3 IMPLANT
GAUZE SPONGE 4X4 12PLY STRL (GAUZE/BANDAGES/DRESSINGS) ×3 IMPLANT
GLOVE BIO SURGEON STRL SZ7.5 (GLOVE) IMPLANT
GLOVE BIO SURGEON STRL SZ8 (GLOVE) ×3 IMPLANT
GLOVE BIOGEL PI IND STRL 6.5 (GLOVE) IMPLANT
GLOVE BIOGEL PI IND STRL 8 (GLOVE) ×1 IMPLANT
GLOVE BIOGEL PI INDICATOR 6.5 (GLOVE)
GLOVE BIOGEL PI INDICATOR 8 (GLOVE) ×2
GLOVE SURG SS PI 6.5 STRL IVOR (GLOVE) IMPLANT
GOWN STRL REUS W/TWL LRG LVL3 (GOWN DISPOSABLE) ×3 IMPLANT
GOWN STRL REUS W/TWL XL LVL3 (GOWN DISPOSABLE) IMPLANT
HANDPIECE INTERPULSE COAX TIP (DISPOSABLE) ×2
IMMOBILIZER KNEE 20 (SOFTGOODS) ×3
IMMOBILIZER KNEE 20 THIGH 36 (SOFTGOODS) ×1 IMPLANT
MANIFOLD NEPTUNE II (INSTRUMENTS) ×3 IMPLANT
NS IRRIG 1000ML POUR BTL (IV SOLUTION) ×3 IMPLANT
PACK TOTAL KNEE CUSTOM (KITS) ×3 IMPLANT
PAD ABD 8X10 STRL (GAUZE/BANDAGES/DRESSINGS) ×3 IMPLANT
PADDING CAST COTTON 6X4 STRL (CAST SUPPLIES) ×3 IMPLANT
POSITIONER SURGICAL ARM (MISCELLANEOUS) ×3 IMPLANT
SET HNDPC FAN SPRY TIP SCT (DISPOSABLE) ×1 IMPLANT
STRIP CLOSURE SKIN 1/2X4 (GAUZE/BANDAGES/DRESSINGS) ×2 IMPLANT
SUT MNCRL AB 4-0 PS2 18 (SUTURE) ×3 IMPLANT
SUT STRATAFIX 0 PDS 27 VIOLET (SUTURE) ×3
SUT VIC AB 2-0 CT1 27 (SUTURE) ×6
SUT VIC AB 2-0 CT1 TAPERPNT 27 (SUTURE) ×3 IMPLANT
SUTURE STRATFX 0 PDS 27 VIOLET (SUTURE) ×1 IMPLANT
SYR 50ML LL SCALE MARK (SYRINGE) IMPLANT
TRAY FOLEY W/METER SILVER 14FR (SET/KITS/TRAYS/PACK) ×3 IMPLANT
TRAY FOLEY W/METER SILVER 16FR (SET/KITS/TRAYS/PACK) IMPLANT
WATER STERILE IRR 1000ML POUR (IV SOLUTION) ×6 IMPLANT
WRAP KNEE MAXI GEL POST OP (GAUZE/BANDAGES/DRESSINGS) ×3 IMPLANT
YANKAUER SUCT BULB TIP 10FT TU (MISCELLANEOUS) ×3 IMPLANT

## 2016-12-16 NOTE — Transfer of Care (Signed)
Immediate Anesthesia Transfer of Care Note  Patient: Jane [name]  Procedure(s) Performed: Procedure(s) with comments: LEFT TOTAL KNEE ARTHROPLASTY (Left) - requests 48mins  Patient Location: PACU  Anesthesia Type:Spinal  Level of Consciousness: awake, alert  and oriented  Airway & Oxygen Therapy: Patient Spontanous Breathing and Patient connected to face mask oxygen  Post-op Assessment: Report given to RN and Post -op Vital signs reviewed and stable  Post vital signs: Reviewed and stable  Last Vitals:  Vitals:   12/16/16 1354 12/16/16 1400  BP: (!) 172/70 (!) 175/70  Pulse: 89 94  Resp: 12 15  Temp:      Last Pain:  Vitals:   12/16/16 1338  TempSrc: Oral         Complications: No apparent anesthesia complications

## 2016-12-16 NOTE — Anesthesia Postprocedure Evaluation (Addendum)
Anesthesia Post Note  Patient: Jane [name]  Procedure(s) Performed: Procedure(s) (LRB): LEFT TOTAL KNEE ARTHROPLASTY (Left)  Patient location during evaluation: PACU Anesthesia Type: Spinal Level of consciousness: oriented and awake and alert Pain management: pain level controlled Vital Signs Assessment: post-procedure vital signs reviewed and stable Respiratory status: spontaneous breathing, respiratory function stable and patient connected to nasal cannula oxygen Cardiovascular status: blood pressure returned to baseline and stable Postop Assessment: no headache and no backache Anesthetic complications: no       Last Vitals:  Vitals:   12/16/16 1630 12/16/16 1645  BP: 130/69 (!) 145/68  Pulse: 89 81  Resp: 15 12  Temp:      Last Pain:  Vitals:   12/16/16 1645  TempSrc:   PainSc: Asleep                 [name] S

## 2016-12-16 NOTE — Interval H&P Note (Signed)
History and Physical Interval Note:  12/16/2016 1:38 PM  Jane [name]  has presented today for surgery, with the diagnosis of Osteoarthritis Left Knee  The various methods of treatment have been discussed with the patient and family. After consideration of risks, benefits and other options for treatment, the patient has consented to  Procedure(s) with comments: LEFT TOTAL KNEE ARTHROPLASTY (Left) - requests 78mins as a surgical intervention .  The patient's history has been reviewed, patient examined, no change in status, stable for surgery.  I have reviewed the patient's chart and labs.  Questions were answered to the patient's satisfaction.     Gearlean Alf

## 2016-12-16 NOTE — H&P (View-Only) (Signed)
Jane [name] DOB: 06-13-1941 Divorced / Language: [place] / Race: White Female Date of Admission:  12/16/2016 CC:  Left knee Pain History of Present Illness The patient is a 76 year old female who comes in for a preoperative History and Physical. The patient is scheduled for a left total knee arthroplasty to be performed by Dr. Gus Rankin. Aluisio, MD at [facility] on 12-16-2016. The patient is a 76 year old female who presented with knee complaints. The patient was seen in referral from Dr. Darrelyn Hillock. The patient reports left knee symptoms including: pain which began month(s) (since surgery) ago following a specific injury. The patient describes the severity of the symptoms as severe (with activity).The patient feels that the symptoms are worsening. Note for "Knee pain": The patient does have a history of a left knee scope by Dr. Darrelyn Hillock on 04-10-2016. She is unable to describe her pain. She has tried a cortisone injection, physical therapy and a Land. She has been taking voltaren and tylenol for pain. She has been trying to stay off her feet and has stopped doing her exercises due to the pain. She has been wearing a knee brace and that seems to help. Unfortunately, Jane [name]'s left knee has gotten progressively worse since she had the arthroscopy back in August. She is now hurting at all times. It is limiting what she can and cannot do. She has had cortisone injections. Unfortunately, she has pain day and night as well as at rest. She feels like the knee is essentially taken over her life. She would like a solution to this, so she could be more active. She is ready to proceed with surgery. They have been treated conservatively in the past for the above stated problem and despite conservative measures, they continue to have progressive pain and severe functional limitations and dysfunction. They have failed non-operative management including home exercise, medications, and injections. It is  felt that they would benefit from undergoing total joint replacement. Risks and benefits of the procedure have been discussed with the patient and they elect to proceed with surgery. There are no active contraindications to surgery such as ongoing infection or rapidly progressive neurological disease.  Problem List/Past Medical Primary localized osteoarthritis of left knee (M17.12)  Chronic pain of left knee (M25.562)  High blood pressure  Hypercholesterolemia  Allergic Urticaria  Heart murmur  Osteoarthritis  Vertigo  Shingles  Depression  Cataract  Left Bundle Branch Block  Rheumatic Fever  Childhood Illness Hemorrhoids  Menopause   Allergies Very sensitive to sedatives and very sensitive to stimulants Cipro *FLUOROQUINOLONES*  mulitple joint pain Flagyl *Anti-infective Agents - Misc.**  Hives. Penicillins  Rash, Diarrhea. Etodolac *ANALGESICS - ANTI-INFLAMMATORY*  stomach upset Clindamycin HCl *Anti-infective Agents - Misc.**  caused C.Diff Levaquin *FLUOROQUINOLONES*  does not remember RXN  Family History Cancer  Mother. Chronic Obstructive Lung Disease  Mother, Sister. Depression  Mother. Diabetes Mellitus  Sister. Heart Disease  Father, Maternal Grandfather, Paternal Grandfather. Hypertension  Father, Mother, Sister. Osteoarthritis  Mother. Rheumatoid Arthritis  Maternal Grandmother.  Social History  Children  2 Current drinker  03/13/2016: Currently drinks wine only occasionally per week Current work status  retired Scientist, physiological weekly; does running / walking Living situation  live alone Marital status  divorced No history of drug/alcohol rehab  Not under pain contract  Number of flights of stairs before winded  2-3 Tobacco / smoke exposure  03/13/2016: yes Tobacco use  Never smoker. 03/13/2016: uses less than 1/2 can(s)  smokeless per week Advance Directives  Living Will, Healthcare POA  Medication  History Diclofenac (75MG  Tablet, Oral) Active. Meclizine HCl (25MG  Tablet, Oral) Active. (as needed for Vertigo) Citalopram Hydrobromide (20MG  Tablet, Oral) Active. Simvastatin (20MG  Tablet, Oral) Active. Lisinopril (20MG  Tablet, Oral) Active. Tylenol (prn) Active. Multiple Vitamin (1 (one) Oral) Active. Calcium Citrate (200MG  Tablet, 1 (one) Oral) Active. Magnesium Active. Vit D Active. Turmeric Active. Aspirin (81MG  Tablet Chewable, Oral) Active.   Past Surgical History  Tonsillectomy  Date: 43. Right Leg Skin Graft  Date: 1969. Tubal Ligation  Date: 69. Rhinoplasty  Date: 41. Cataract Surgery  right Colon Polyp Removal - Colonoscopy  Leg Circulation Surgery  bilateral  Review of Systems General Not Present- Chills, Fatigue, Fever, Memory Loss, Night Sweats, Weight Gain and Weight Loss. Skin Not Present- Eczema, Hives, Itching, Lesions and Rash. HEENT Not Present- Dentures, Double Vision, Headache, Hearing Loss, Tinnitus and Visual Loss. Respiratory Present- Shortness of breath with exertion. Not Present- Allergies, Chronic Cough, Coughing up blood and Shortness of breath at rest. Cardiovascular Present- Murmur. Not Present- Chest Pain, Difficulty Breathing Lying Down, Palpitations, Racing/skipping heartbeats and Swelling. Gastrointestinal Not Present- Abdominal Pain, Bloody Stool, Constipation, Diarrhea, Difficulty Swallowing, Heartburn, Jaundice, Loss of appetitie, Nausea and Vomiting. Female Genitourinary Not Present- Blood in Urine, Discharge, Flank Pain, Incontinence, Painful Urination, Urgency, Urinary frequency, Urinary Retention, Urinating at Night and Weak urinary stream. Musculoskeletal Present- Morning Stiffness and Muscle Weakness. Not Present- Back Pain, Joint Pain, Joint Swelling, Muscle Pain and Spasms. Neurological Not Present- Blackout spells, Difficulty with balance, Dizziness, Paralysis, Tremor and Weakness. Psychiatric Not Present-  Insomnia.  Vitals  Weight: 170 lb Height: 62in Weight was reported by patient. Height was reported by patient. Body Surface Area: 1.78 m Body Mass Index: 31.09 kg/m  Pulse: 76 (Regular)  BP: 132/74 (Sitting, Right Arm, Standard   Physical Exam General Mental Status -Alert, cooperative and good historian. General Appearance-pleasant, Not in acute distress. Orientation-Oriented X3. Build & Nutrition-Well nourished and Well developed.  Head and Neck Head-normocephalic, atraumatic . Neck Global Assessment - bruit auscultated on the left(very faint), supple, no bruit auscultated on the right.  Eye Pupil - Bilateral-Regular and Round. Motion - Bilateral-EOMI.  Chest and Lung Exam Auscultation Breath sounds - clear at anterior chest wall and clear at posterior chest wall. Adventitious sounds - No Adventitious sounds.  Cardiovascular Auscultation Rhythm - Regular rate and rhythm. Heart Sounds - S1 WNL and S2 WNL. Murmurs & Other Heart Sounds - Auscultation of the heart reveals - No Murmurs.  Abdomen Palpation/Percussion Tenderness - Abdomen is non-tender to palpation. Rigidity (guarding) - Abdomen is soft. Auscultation Auscultation of the abdomen reveals - Bowel sounds normal.  Female Genitourinary Note: Not done, not pertinent to present illness   Musculoskeletal Note: On exam, she is in no distress. Evaluation of her left hip shows normal range of motion with no discomfort. Her left knee shows no effusion. Her range is about 5 to 120. There is marked crepitus on range of motion with tenderness medial greater than lateral and no instability. Right knee range 0 to 130 with no tenderness or instability.  RADIOGRAPHS From Dr. Fatima Sanger office from about four months ago showed she has got bone on bone arthritis medial patellofemoral compartments and almost bone on bone lateral. I reviewed some of her scope pictures. It showed exposed bone in the  medial and lateral compartments.  Assessment & Plan  Primary localized osteoarthritis of left knee (M17.12)  Note:Surgical Plans: Left Total Knee Replacement  Disposition: Home with caregiver, Straight to outpatient therapy at [facility] in [place], [place] on Friday 12/20/2016.  PCP: Dr. Sherril Croon - pending preop workup  IV TXA  Anesthesia Issues: None  Patient was instructed on what medications to stop prior to surgery.  Signed electronically by Lauraine Rinne, III PA-C

## 2016-12-16 NOTE — Anesthesia Preprocedure Evaluation (Signed)
Anesthesia Evaluation  Patient identified by MRN, date of birth, ID band Patient awake    Reviewed: Allergy & Precautions, NPO status , Patient's Chart, lab work & pertinent test results  Airway Mallampati: II  TM Distance: >3 FB Neck ROM: Full    Dental no notable dental hx.    Pulmonary neg pulmonary ROS,    Pulmonary exam normal breath sounds clear to auscultation       Cardiovascular hypertension, Normal cardiovascular exam Rhythm:Regular Rate:Normal     Neuro/Psych negative neurological ROS  negative psych ROS   GI/Hepatic negative GI ROS, Neg liver ROS,   Endo/Other  negative endocrine ROS  Renal/GU negative Renal ROS  negative genitourinary   Musculoskeletal negative musculoskeletal ROS (+)   Abdominal   Peds negative pediatric ROS (+)  Hematology negative hematology ROS (+)   Anesthesia Other Findings   Reproductive/Obstetrics negative OB ROS                             Anesthesia Physical Anesthesia Plan  ASA: II  Anesthesia Plan: Spinal   Post-op Pain Management:    Induction: Intravenous  Airway Management Planned: Simple Face Mask  Additional Equipment:   Intra-op Plan:   Post-operative Plan:   Informed Consent: I have reviewed the patients History and Physical, chart, labs and discussed the procedure including the risks, benefits and alternatives for the proposed anesthesia with the patient or authorized representative who has indicated his/her understanding and acceptance.   Dental advisory given  Plan Discussed with: CRNA and Surgeon  Anesthesia Plan Comments:         Anesthesia Quick Evaluation

## 2016-12-16 NOTE — Anesthesia Procedure Notes (Addendum)
Anesthesia Regional Block: Adductor canal block   Pre-Anesthetic Checklist: ,, timeout performed, Correct Patient, Correct Site, Correct Laterality, Correct Procedure, Correct Position, site marked, Risks and benefits discussed,  Surgical consent,  Pre-op evaluation,  At surgeon's request and post-op pain management  Laterality: Left  Prep: chloraprep       Needles:  Injection technique: Single-shot  Needle Type: Echogenic Needle     Needle Length: 9cm      Additional Needles:   Procedures: ultrasound guided,,,,,,,,  Narrative:  Start time: 12/16/2016 1:40 PM End time: 12/16/2016 1:50 PM Injection made incrementally with aspirations every 5 mL.  Performed by: Personally  Anesthesiologist: Yerick Eggebrecht  Additional Notes: Patient tolerated the procedure well without complications

## 2016-12-16 NOTE — Progress Notes (Signed)
Assisted Dr. Rose with left, ultrasound guided, adductor canal block. Side rails up, monitors on throughout procedure. See vital signs in flow sheet. Tolerated Procedure well.  

## 2016-12-16 NOTE — Anesthesia Procedure Notes (Signed)
Spinal  Patient location during procedure: OR Start time: 12/16/2016 2:43 PM End time: 12/16/2016 2:45 PM Staffing Resident/CRNA: Chandra Batch A Performed: resident/CRNA  Preanesthetic Checklist Completed: patient identified, site marked, surgical consent, pre-op evaluation, timeout performed, IV checked, risks and benefits discussed and monitors and equipment checked Spinal Block Patient position: sitting Prep: Betadine Patient monitoring: heart rate, continuous pulse ox and blood pressure Approach: midline Location: L2-3 Injection technique: single-shot Needle Needle type: Quincke  Needle gauge: 24 G Needle length: 10 cm Needle insertion depth: 5 cm Assessment Sensory level: T8

## 2016-12-16 NOTE — Op Note (Signed)
OPERATIVE REPORT-TOTAL KNEE ARTHROPLASTY   Pre-operative diagnosis- Osteoarthritis  Left knee(s)  Post-operative diagnosis- Osteoarthritis Left knee(s)  Procedure-  Left  Total Knee Arthroplasty (Depuy Attune)  Surgeon- Dione Plover. Horton Ellithorpe, MD  Assistant- Arlee Muslim, PA-C   Anesthesia-  Adductor canal block and spinal  EBL-* No blood loss amount entered *   Drains Hemovac  Tourniquet time- 29 minutes @ 867 mm Hg  Complications- None  Condition-PACU - hemodynamically stable.   Brief Clinical Note  Jane [name] is a 76 y.o. year old female with end stage OA of her left knee with progressively worsening pain and dysfunction. She has constant pain, with activity and at rest and significant functional deficits with difficulties even with ADLs. She has had extensive non-op management including analgesics, injections of cortisone and viscosupplements, and home exercise program, but remains in significant pain with significant dysfunction. Radiographs show bone on bone arthritis medial and patellofemoral. She presents now for left Total Knee Arthroplasty.    Procedure in detail---   The patient is brought into the operating room and positioned supine on the operating table. After successful administration of  Adductor canal block and spinal,   a tourniquet is placed high on the  Left thigh(s) and the lower extremity is prepped and draped in the usual sterile fashion. Time out is performed by the operating team and then the  Left lower extremity is wrapped in Esmarch, knee flexed and the tourniquet inflated to 300 mmHg.       A midline incision is made with a ten blade through the subcutaneous tissue to the level of the extensor mechanism. A fresh blade is used to make a medial parapatellar arthrotomy. Soft tissue over the proximal medial tibia is subperiosteally elevated to the joint line with a knife and into the semimembranosus bursa with a Cobb elevator. Soft tissue over the proximal  lateral tibia is elevated with attention being paid to avoiding the patellar tendon on the tibial tubercle. The patella is everted, knee flexed 90 degrees and the ACL and PCL are removed. Findings are bone on bone medial and patellofemoral.        The drill is used to create a starting hole in the distal femur and the canal is thoroughly irrigated with sterile saline to remove the fatty contents. The 5 degree Left  valgus alignment guide is placed into the femoral canal and the distal femoral cutting block is pinned to remove 10 mm off the distal femur. Resection is made with an oscillating saw.      The tibia is subluxed forward and the menisci are removed. The extramedullary alignment guide is placed referencing proximally at the medial aspect of the tibial tubercle and distally along the second metatarsal axis and tibial crest. The block is pinned to remove 77mm off the more deficient medial  side. Resection is made with an oscillating saw. Size 4is the most appropriate size for the tibia and the proximal tibia is prepared with the modular drill and keel punch for that size.      The femoral sizing guide is placed and size 5 is most appropriate. Rotation is marked off the epicondylar axis and confirmed by creating a rectangular flexion gap at 90 degrees. The size 5 cutting block is pinned in this rotation and the anterior, posterior and chamfer cuts are made with the oscillating saw. The intercondylar block is then placed and that cut is made.      Trial size 4 tibial component, trial  size 5 narrow posterior stabilized femur and a 8  mm posterior stabilized rotating platform insert trial is placed. Full extension is achieved with excellent varus/valgus and anterior/posterior balance throughout full range of motion. The patella is everted and thickness measured to be 21  mm. Free hand resection is taken to 12 mm, a 35 template is placed, lug holes are drilled, trial patella is placed, and it tracks normally.  Osteophytes are removed off the posterior femur with the trial in place. All trials are removed and the cut bone surfaces prepared with pulsatile lavage. Cement is mixed and once ready for implantation, the size 4 tibial implant, size  5 narrow posterior stabilized femoral component, and the size 35 patella are cemented in place and the patella is held with the clamp. The trial insert is placed and the knee held in full extension. The Exparel (20 ml mixed with 30 ml saline) is injected into the extensor mechanism, posterior capsule, medial and lateral gutters and subcutaneous tissues.  All extruded cement is removed and once the cement is hard the permanent 8 mm posterior stabilized rotating platform insert is placed into the tibial tray.      The wound is copiously irrigated with saline solution and the extensor mechanism closed over a hemovac drain with #1 V-loc suture. The tourniquet is released for a total tourniquet time of 29  minutes. Flexion against gravity is 140 degrees and the patella tracks normally. Subcutaneous tissue is closed with 2.0 vicryl and subcuticular with running 4.0 Monocryl. The incision is cleaned and dried and steri-strips and a bulky sterile dressing are applied. The limb is placed into a knee immobilizer and the patient is awakened and transported to recovery in stable condition.      Please note that a surgical assistant was a medical necessity for this procedure in order to perform it in a safe and expeditious manner. Surgical assistant was necessary to retract the ligaments and vital neurovascular structures to prevent injury to them and also necessary for proper positioning of the limb to allow for anatomic placement of the prosthesis.   Dione Plover Jane Mccall, MD    12/16/2016, 3:43 PM

## 2016-12-17 ENCOUNTER — Encounter (HOSPITAL_COMMUNITY): Payer: Self-pay | Admitting: Orthopedic Surgery

## 2016-12-17 LAB — BASIC METABOLIC PANEL
Anion gap: 8 (ref 5–15)
BUN: 12 mg/dL (ref 6–20)
CHLORIDE: 103 mmol/L (ref 101–111)
CO2: 23 mmol/L (ref 22–32)
Calcium: 8.7 mg/dL — ABNORMAL LOW (ref 8.9–10.3)
Creatinine, Ser: 0.69 mg/dL (ref 0.44–1.00)
GFR calc Af Amer: >60 mL/min (ref >60)
GFR calc non Af Amer: >60 mL/min (ref >60)
Glucose, Bld: 174 mg/dL — ABNORMAL HIGH (ref 65–99)
POTASSIUM: 4.5 mmol/L (ref 3.5–5.1)
Sodium: 134 mmol/L — ABNORMAL LOW (ref 135–145)

## 2016-12-17 LAB — CBC
HCT: 32.6 % — ABNORMAL LOW (ref 36.0–46.0)
HEMOGLOBIN: 11.1 g/dL — AB (ref 12.0–15.0)
MCH: 29.8 pg (ref 26.0–34.0)
MCHC: 34 g/dL (ref 30.0–36.0)
MCV: 87.4 fL (ref 78.0–100.0)
PLATELETS: 243 10*3/uL (ref 150–400)
RBC: 3.73 MIL/uL — AB (ref 3.87–5.11)
RDW: 12.5 % (ref 11.5–15.5)
WBC: 12.1 10*3/uL — ABNORMAL HIGH (ref 4.0–10.5)

## 2016-12-17 MED ORDER — METHOCARBAMOL 500 MG PO TABS: 500.0000 mg | ORAL_TABLET | Freq: Four times a day (QID) | ORAL | 0 refills | Status: DC | PRN

## 2016-12-17 MED ORDER — OXYCODONE HCL 5 MG PO TABS: 5.0000 mg | ORAL_TABLET | ORAL | 0 refills | Status: DC | PRN

## 2016-12-17 MED ORDER — RIVAROXABAN 10 MG PO TABS: 10.0000 mg | ORAL_TABLET | Freq: Every day | ORAL | 0 refills | Status: DC

## 2016-12-17 NOTE — Progress Notes (Signed)
Home with assist from a hired caregiver. Lives in a 1 story home with 2-3 steps. No DME needs. RW ordered through [facility] for home delivery and has a raised toilet seat. Scheduled to start OP PT at [facility] on 12-20-16. Home with OPPT. [phone]

## 2016-12-17 NOTE — Discharge Instructions (Addendum)
° °Dr. Frank Aluisio °Total Joint Specialist °[facility] °[address] °[place], [place] [address] °[phone] ° °TOTAL KNEE REPLACEMENT POSTOPERATIVE DIRECTIONS ° °Knee Rehabilitation, Guidelines Following Surgery  °Results after knee surgery are often greatly improved when you follow the exercise, range of motion and muscle strengthening exercises prescribed by your doctor. Safety measures are also important to protect the knee from further injury. Any time any of these exercises cause you to have increased pain or swelling in your knee joint, decrease the amount until you are comfortable again and slowly increase them. If you have problems or questions, call your caregiver or physical therapist for advice.  ° °HOME CARE INSTRUCTIONS  °Remove items at home which could result in a fall. This includes throw rugs or furniture in walking pathways.  °· ICE to the affected knee every three hours for 30 minutes at a time and then as needed for pain and swelling.  Continue to use ice on the knee for pain and swelling from surgery. You may notice swelling that will progress down to the foot and ankle.  This is normal after surgery.  Elevate the leg when you are not up walking on it.   °· Continue to use the breathing machine which will help keep your temperature down.  It is common for your temperature to cycle up and down following surgery, especially at night when you are not up moving around and exerting yourself.  The breathing machine keeps your lungs expanded and your temperature down. °· Do not place pillow under knee, focus on keeping the knee straight while resting ° °DIET °You may resume your previous home diet once your are discharged from the hospital. ° °DRESSING / WOUND CARE / SHOWERING °You may shower 3 days after surgery, but keep the wounds dry during showering.  You may use an occlusive plastic wrap (Press'n Seal for example), NO SOAKING/SUBMERGING IN THE BATHTUB.  If the  bandage gets wet, change with a clean dry gauze.  If the incision gets wet, pat the wound dry with a clean towel. °You may start showering once you are discharged home but do not submerge the incision under water. Just pat the incision dry and apply a dry gauze dressing on daily. °Change the surgical dressing daily and reapply a dry dressing each time. ° °ACTIVITY °Walk with your walker as instructed. °Use walker as long as suggested by your caregivers. °Avoid periods of inactivity such as sitting longer than an hour when not asleep. This helps prevent blood clots.  °You may resume a sexual relationship in one month or when given the OK by your doctor.  °You may return to work once you are cleared by your doctor.  °Do not drive a car for 6 weeks or until released by you surgeon.  °Do not drive while taking narcotics. ° °WEIGHT BEARING °Weight bearing as tolerated with assist device (walker, cane, etc) as directed, use it as long as suggested by your surgeon or therapist, typically at least 4-6 weeks. ° °POSTOPERATIVE CONSTIPATION PROTOCOL °Constipation - defined medically as fewer than three stools per week and severe constipation as less than one stool per week. ° °One of the most common issues patients have following surgery is constipation.  Even if you have a regular bowel pattern at home, your normal regimen is likely to be disrupted due to multiple reasons following surgery.  Combination of anesthesia, postoperative narcotics, change in appetite and fluid intake all can affect your bowels.    In order to avoid complications following surgery, here are some recommendations in order to help you during your recovery period. ° °Colace (docusate) - Pick up an over-the-counter form of Colace or another stool softener and take twice a day as long as you are requiring postoperative pain medications.  Take with a full glass of water daily.  If you experience loose stools or diarrhea, hold the colace until you stool forms  back up.  If your symptoms do not get better within 1 week or if they get worse, check with your doctor. ° °Dulcolax (bisacodyl) - Pick up over-the-counter and take as directed by the product packaging as needed to assist with the movement of your bowels.  Take with a full glass of water.  Use this product as needed if not relieved by Colace only.  ° °MiraLax (polyethylene glycol) - Pick up over-the-counter to have on hand.  MiraLax is a solution that will increase the amount of water in your bowels to assist with bowel movements.  Take as directed and can mix with a glass of water, juice, soda, coffee, or tea.  Take if you go more than two days without a movement. °Do not use MiraLax more than once per day. Call your doctor if you are still constipated or irregular after using this medication for 7 days in a row. ° °If you continue to have problems with postoperative constipation, please contact the office for further assistance and recommendations.  If you experience "the worst abdominal pain ever" or develop nausea or vomiting, please contact the office immediatly for further recommendations for treatment. ° °ITCHING ° If you experience itching with your medications, try taking only a single pain pill, or even half a pain pill at a time.  You can also use Benadryl over the counter for itching or also to help with sleep.  ° °TED HOSE STOCKINGS °Wear the elastic stockings on both legs for three weeks following surgery during the day but you may remove then at night for sleeping. ° °MEDICATIONS °See your medication summary on the “After Visit Summary” that the nursing staff will review with you prior to discharge.  You may have some home medications which will be placed on hold until you complete the course of blood thinner medication.  It is important for you to complete the blood thinner medication as prescribed by your surgeon.  Continue your approved medications as instructed at time of  discharge. ° °PRECAUTIONS °If you experience chest pain or shortness of breath - call 911 immediately for transfer to the hospital emergency department.  °If you develop a fever greater that 101 F, purulent drainage from wound, increased redness or drainage from wound, foul odor from the wound/dressing, or calf pain - CONTACT YOUR SURGEON.   °                                                °FOLLOW-UP APPOINTMENTS °Make sure you keep all of your appointments after your operation with your surgeon and caregivers. You should call the office at the above phone number and make an appointment for approximately two weeks after the date of your surgery or on the date instructed by your surgeon outlined in the "After Visit Summary". ° ° °RANGE OF MOTION AND STRENGTHENING EXERCISES  °Rehabilitation of the knee is important following a knee injury or   an operation. After just a few days of immobilization, the muscles of the thigh which control the knee become weakened and shrink (atrophy). Knee exercises are designed to build up the tone and strength of the thigh muscles and to improve knee motion. Often times heat used for twenty to thirty minutes before working out will loosen up your tissues and help with improving the range of motion but do not use heat for the first two weeks following surgery. These exercises can be done on a training (exercise) mat, on the floor, on a table or on a bed. Use what ever works the best and is most comfortable for you Knee exercises include:  °Leg Lifts - While your knee is still immobilized in a splint or cast, you can do straight leg raises. Lift the leg to 60 degrees, hold for 3 sec, and slowly lower the leg. Repeat 10-20 times 2-3 times daily. Perform this exercise against resistance later as your knee gets better.  °Quad and Hamstring Sets - Tighten up the muscle on the front of the thigh (Quad) and hold for 5-10 sec. Repeat this 10-20 times hourly. Hamstring sets are done by pushing the  foot backward against an object and holding for 5-10 sec. Repeat as with quad sets.  °· Leg Slides: Lying on your back, slowly slide your foot toward your buttocks, bending your knee up off the floor (only go as far as is comfortable). Then slowly slide your foot back down until your leg is flat on the floor again. °· Angel Wings: Lying on your back spread your legs to the side as far apart as you can without causing discomfort.  °A rehabilitation program following serious knee injuries can speed recovery and prevent re-injury in the future due to weakened muscles. Contact your doctor or a physical therapist for more information on knee rehabilitation.  ° °IF YOU ARE TRANSFERRED TO A SKILLED REHAB FACILITY °If the patient is transferred to a skilled rehab facility following release from the hospital, a list of the current medications will be sent to the facility for the patient to continue.  When discharged from the skilled rehab facility, please have the facility set up the patient's Home Health Physical Therapy prior to being released. Also, the skilled facility will be responsible for providing the patient with their medications at time of release from the facility to include their pain medication, the muscle relaxants, and their blood thinner medication. If the patient is still at the rehab facility at time of the two week follow up appointment, the skilled rehab facility will also need to assist the patient in arranging follow up appointment in our office and any transportation needs. ° °MAKE SURE YOU:  °Understand these instructions.  °Get help right away if you are not doing well or get worse.  ° ° °Pick up stool softner and laxative for home use following surgery while on pain medications. °Do not submerge incision under water. °Please use good hand washing techniques while changing dressing each day. °May shower starting three days after surgery. °Please use a clean towel to pat the incision dry following  showers. °Continue to use ice for pain and swelling after surgery. °Do not use any lotions or creams on the incision until instructed by your surgeon. ° °Take Xarelto for two and a half more weeks following discharge from the hospital, then discontinue Xarelto. °Once the patient has completed the Xarelto, they may resume the 81 mg Aspirin. ° ° ° °Information on   my medicine - XARELTO (Rivaroxaban)  This medication education was reviewed with me or my healthcare representative as part of my discharge preparation.  The pharmacist that spoke with me during my hospital stay was:  Minda Ditto, [facility]  Why was Xarelto prescribed for you? Xarelto was prescribed for you to reduce the risk of blood clots forming after orthopedic surgery. The medical term for these abnormal blood clots is venous thromboembolism (VTE).  What do you need to know about xarelto ? Take your Xarelto ONCE DAILY at the same time every day. You may take it either with or without food.  If you have difficulty swallowing the tablet whole, you may crush it and mix in applesauce just prior to taking your dose.  Take Xarelto exactly as prescribed by your doctor and DO NOT stop taking Xarelto without talking to the doctor who prescribed the medication.  Stopping without other VTE prevention medication to take the place of Xarelto may increase your risk of developing a clot.  After discharge, you should have regular check-up appointments with your healthcare provider that is prescribing your Xarelto.    What do you do if you miss a dose? If you miss a dose, take it as soon as you remember on the same day then continue your regularly scheduled once daily regimen the next day. Do not take two doses of Xarelto on the same day.   Important Safety Information A possible side effect of Xarelto is bleeding. You should call your healthcare provider right away if you experience any of the following: ? Bleeding from an injury or your  nose that does not stop. ? Unusual colored urine (red or dark brown) or unusual colored stools (red or black). ? Unusual bruising for unknown reasons. ? A serious fall or if you hit your head (even if there is no bleeding).  Some medicines may interact with Xarelto and might increase your risk of bleeding while on Xarelto. To help avoid this, consult your healthcare provider or pharmacist prior to using any new prescription or non-prescription medications, including herbals, vitamins, non-steroidal anti-inflammatory drugs (NSAIDs) and supplements.  Discussed holding Voltaren, Aspirin and Turmeric while on Xarelto  This website has more information on Xarelto: [email or web address]

## 2016-12-17 NOTE — Evaluation (Signed)
Occupational Therapy Evaluation Patient Details Name: Jane [name] MRN: [id number] DOB: [birth year] Today's Date: 12/17/2016    History of Present Illness LEFT TOTAL KNEE ARTHROPLASTY (Left)   Clinical Impression   Pt is s/p TKA resulting in the deficits listed below (see OT Problem List).  Pt will benefit from skilled OT to increase their safety and independence with ADL and functional mobility for ADL to facilitate discharge to venue listed below.        Follow Up Recommendations  No OT follow up    Equipment Recommendations  None recommended by OT           Mobility Bed Mobility Overal bed mobility: Needs Assistance Bed Mobility: Supine to Sit     Supine to sit: Max assist     General bed mobility comments: pt with increased pain with tears upon sitting up. RN came and gave IV and oral pain meds  Transfers Overall transfer level: Needs assistance Equipment used: Rolling walker (2 wheeled) Transfers: Sit to/from [employer] Sit to Stand: Mod assist Stand pivot transfers: Mod assist       General transfer comment: VC for technique and encouragement    Balance                                           ADL either performed or assessed with clinical judgement   ADL Overall ADL's : Needs assistance/impaired Eating/Feeding: Set up;Sitting   Grooming: Set up;Sitting   Upper Body Bathing: Set up;Sitting   Lower Body Bathing: Maximal assistance;Sit to/from stand;Cueing for safety;Cueing for compensatory techniques   Upper Body Dressing : Set up;Sitting   Lower Body Dressing: Maximal assistance;Sit to/from stand;Cueing for safety;Cueing for compensatory techniques   Toilet Transfer: Moderate assistance;RW;Cueing for sequencing;Cueing for safety;BSC   Toileting- Clothing Manipulation and Hygiene: Moderate assistance;Sit to/from stand;Cueing for safety;Cueing for compensatory techniques               Vision Patient  Visual Report: No change from baseline              Pertinent Vitals/Pain Pain Assessment: 0-10 Pain Score: 7      Hand Dominance     Extremity/Trunk Assessment Upper Extremity Assessment Upper Extremity Assessment: Overall WFL for tasks assessed           Communication Communication Communication: No difficulties   Cognition Arousal/Alertness: Awake/alert Behavior During Therapy: WFL for tasks assessed/performed Overall Cognitive Status: Within Functional Limits for tasks assessed                                                Home Living Family/patient expects to be discharged to:: Private residence Living Arrangements: Alone Available Help at Discharge: Personal care attendant Type of Home: House Home Access: Stairs to enter [employer] of Steps: 2   Home Layout: One level     Bathroom Shower/Tub: Walk-in shower         Home Equipment: Grab bars - tub/shower;Bedside commode;Crutches   Additional Comments: reacher      Prior Functioning/Environment Level of Independence: Independent                 OT Problem List: Decreased strength;Decreased activity tolerance;Pain;Decreased knowledge of use of DME or AE  OT Treatment/Interventions: Self-care/ADL training;DME and/or AE instruction;Patient/family education    OT Goals(Current goals can be found in the care plan section) Acute Rehab OT Goals Patient Stated Goal: get well OT Goal Formulation: With patient Time For Goal Achievement: 12/31/16 Potential to Achieve Goals: Good  OT Frequency: Min 2X/week   Barriers to D/C:               End of Session Equipment Utilized During Treatment: Engineer, water Communication: Mobility status  Activity Tolerance: Patient limited by pain Patient left: in chair;with call bell/phone within reach;with family/visitor present  OT Visit Diagnosis: Muscle weakness (generalized) (M62.81);Pain;Unsteadiness on feet  (R26.81);Other abnormalities of gait and mobility (R26.89) Pain - Right/Left: Left Pain - part of body: Knee                Time: 0347-4259 OT Time Calculation (min): 43 min Charges:  OT General Charges $OT Visit: 1 Procedure OT Evaluation $OT Eval Moderate Complexity: 1 Procedure OT Treatments $Self Care/Home Management : 23-37 mins G-Codes:     Lise Auer, OT [phone]  Einar Crow D 12/17/2016, 11:16 AM

## 2016-12-17 NOTE — Progress Notes (Signed)
qPhysical Therapy Treatment Patient Details Name: Jane [name] MRN: [id number] DOB: 1941/08/15 Today's Date: 12/17/2016    History of Present Illness LEFT TOTAL KNEE ARTHROPLASTY (Left)    PT Comments    Pt progressing well with mobility.  Will plan to do stair training tomorrow morning, then expect she'll be ready to DC home from PT standpoint.   Follow Up Recommendations  Outpatient PT     Equipment Recommendations  None recommended by PT    Recommendations for Other Services       Precautions / Restrictions Precautions Precautions: Knee Precaution Comments: reviewed no pillow under knee Restrictions Weight Bearing Restrictions: No LLE Weight Bearing: Weight bearing as tolerated    Mobility  Bed Mobility Overal bed mobility: Needs Assistance Bed Mobility: Supine to Sit;Sit to Supine     Supine to sit: Min assist Sit to supine: Min assist   General bed mobility comments: min A to support LLE, instructed pt to self assist LLE with RLE  Transfers Overall transfer level: Needs assistance Equipment used: Rolling walker (2 wheeled) Transfers: Sit to/from [employer] Sit to Stand: Mod assist Stand pivot transfers: Mod assist       General transfer comment: VC for technique and encouragement  Ambulation/Gait Ambulation/Gait assistance: Min guard Ambulation Distance (Feet): 120 Feet Assistive device: Rolling walker (2 wheeled) Gait Pattern/deviations: Step-to pattern;Decreased step length - right;Decreased step length - left;Trunk flexed   Gait velocity interpretation: Below normal speed for age/gender General Gait Details: VCs sequencing and posture, no LOB   Stairs            Wheelchair Mobility    Modified Rankin (Stroke Patients Only)       Balance Overall balance assessment: Needs assistance   Sitting balance-Leahy Scale: Good     Standing balance support: Bilateral upper extremity supported Standing balance-Leahy  Scale: Poor Standing balance comment: relies on BUE support                            Cognition Arousal/Alertness: Awake/alert Behavior During Therapy: WFL for tasks assessed/performed Overall Cognitive Status: Within Functional Limits for tasks assessed                                        Exercises Total Joint Exercises Ankle Circles/Pumps: AROM;Both;10 reps;Supine Short Arc Quad: Right;AAROM;10 reps;Supine Heel Slides: AAROM;Right;10 reps;Supine Hip ABduction/ADduction: AAROM;Right;10 reps;Supine Straight Leg Raises: AAROM;Right;10 reps;Supine Goniometric ROM: 5-40* AAROM R knee    General Comments        Pertinent Vitals/Pain Pain Score: 6  Pain Location: L knee with walking Pain Descriptors / Indicators: Sore Pain Intervention(s): Limited activity within patient's tolerance;Monitored during session;Premedicated before session;Ice applied    Home Living Family/patient expects to be discharged to:: Private residence Living Arrangements: Alone Available Help at Discharge: Personal care attendant Type of Home: House Home Access: Stairs to enter Entrance Stairs-Rails: Right Home Layout: One level Home Equipment: Grab bars - tub/shower;Bedside commode;Crutches Additional Comments: reacher    Prior Function Level of Independence: Independent with assistive device(s)      Comments: ambulated with single crutch   PT Goals (current goals can now be found in the care plan section) Acute Rehab PT Goals Patient Stated Goal: to be able to exercise and lose weight PT Goal Formulation: With patient Time For Goal Achievement: 12/24/16 Potential to Achieve Goals:  Good Progress towards PT goals: Progressing toward goals    Frequency    7X/week      PT Plan Current plan remains appropriate    Co-evaluation             End of Session Equipment Utilized During Treatment: Gait belt Activity Tolerance: Patient tolerated treatment  well Patient left: in bed;with call bell/phone within reach;with family/visitor present Nurse Communication: Mobility status PT Visit Diagnosis: Muscle weakness (generalized) (M62.81);Difficulty in walking, not elsewhere classified (R26.2);Pain Pain - Right/Left: Left Pain - part of body: Knee     Time: 7846-9629 PT Time Calculation (min) (ACUTE ONLY): 27 min  Charges:  $Gait Training: 8-22 mins $Therapeutic Exercise: 8-22 mins                    G Codes:          Ralene Bathe Kistler 12/17/2016, 3:00 PM [phone]

## 2016-12-17 NOTE — Evaluation (Signed)
Physical Therapy Evaluation Patient Details Name: Jane [name] MRN: [id number] DOB: 03/05/1941 Today's Date: 12/17/2016   History of Present Illness  LEFT TOTAL KNEE ARTHROPLASTY (Left)  Clinical Impression  Pt is s/p TKA resulting in the deficits listed below (see PT Problem List). Pt ambulated 60' with RW and performed L TKA exercises with min A.  Pt will benefit from skilled PT to increase their independence and safety with mobility to allow discharge to the venue listed below.      Follow Up Recommendations Outpatient PT    Equipment Recommendations  None recommended by PT    Recommendations for Other Services       Precautions / Restrictions Precautions Precautions: Knee Precaution Comments: reviewed no pillow under knee Restrictions Weight Bearing Restrictions: No LLE Weight Bearing: Weight bearing as tolerated      Mobility  Bed Mobility Overal bed mobility: Needs Assistance Bed Mobility: Supine to Sit     Supine to sit: Max assist     General bed mobility comments: up in recliner  Transfers Overall transfer level: Needs assistance Equipment used: Rolling walker (2 wheeled) Transfers: Sit to/from Stand Sit to Stand: Min assist Stand pivot transfers: Mod assist       General transfer comment: VC for technique, min A to rise  Ambulation/Gait Ambulation/Gait assistance: Min guard Ambulation Distance (Feet): 90 Feet Assistive device: Rolling walker (2 wheeled) Gait Pattern/deviations: Step-to pattern;Decreased step length - right;Decreased step length - left;Trunk flexed   Gait velocity interpretation: Below normal speed for age/gender General Gait Details: VCs sequencing and posture, no LOB  Stairs            Wheelchair Mobility    Modified Rankin (Stroke Patients Only)       Balance Overall balance assessment: Needs assistance   Sitting balance-Leahy Scale: Good     Standing balance support: Bilateral upper extremity  supported Standing balance-Leahy Scale: Poor Standing balance comment: relies on BUE support                             Pertinent Vitals/Pain Pain Assessment: 0-10 Pain Score: 7  Pain Location: L knee with walking Pain Descriptors / Indicators: Sore Pain Intervention(s): Limited activity within patient's tolerance;Monitored during session;Premedicated before session;Ice applied    Home Living Family/patient expects to be discharged to:: Private residence Living Arrangements: Alone Available Help at Discharge: Personal care attendant Type of Home: House Home Access: Stairs to enter Entrance Stairs-Rails: Right Entrance Stairs-Number of Steps: 2 Home Layout: One level Home Equipment: Grab bars - tub/shower;Bedside commode;Crutches Additional Comments: reacher    Prior Function Level of Independence: Independent with assistive device(s)         Comments: ambulated with single crutch     Hand Dominance        Extremity/Trunk Assessment   Upper Extremity Assessment Upper Extremity Assessment: Overall WFL for tasks assessed    Lower Extremity Assessment Lower Extremity Assessment: LLE deficits/detail LLE Deficits / Details: 5-40* AAROM R knee, +2/5 SLR       Communication   Communication: No difficulties  Cognition Arousal/Alertness: Awake/alert Behavior During Therapy: WFL for tasks assessed/performed Overall Cognitive Status: Within Functional Limits for tasks assessed                                        General Comments  Exercises Total Joint Exercises Ankle Circles/Pumps: AROM;Both;10 reps;Supine Short Arc Quad: Right;AAROM;10 reps;Supine Heel Slides: AAROM;Right;10 reps;Supine Straight Leg Raises: AAROM;Right;10 reps;Supine Goniometric ROM: 5-40* AAROM R knee   Assessment/Plan    PT Assessment Patient needs continued PT services  PT Problem List Decreased strength;Decreased range of motion;Decreased activity  tolerance;Decreased balance;Pain;Decreased mobility       PT Treatment Interventions DME instruction;Gait training;Stair training;Functional mobility training;Therapeutic exercise;Therapeutic activities;Patient/family education    PT Goals (Current goals can be found in the Care Plan section)  Acute Rehab PT Goals Patient Stated Goal: to be able to exercise and lose weight PT Goal Formulation: With patient Time For Goal Achievement: 12/24/16 Potential to Achieve Goals: Good    Frequency 7X/week   Barriers to discharge        Co-evaluation               End of Session Equipment Utilized During Treatment: Gait belt Activity Tolerance: Patient tolerated treatment well Patient left: in bed;with call bell/phone within reach Nurse Communication: Mobility status PT Visit Diagnosis: Muscle weakness (generalized) (M62.81);Difficulty in walking, not elsewhere classified (R26.2);Pain Pain - Right/Left: Left Pain - part of body: Knee    Time: 1134-1205 PT Time Calculation (min) (ACUTE ONLY): 31 min   Charges:   PT Evaluation $PT Eval Low Complexity: 1 Procedure PT Treatments $Gait Training: 8-22 mins   PT G Codes:        [phone]  Ralene Bathe Kistler 12/17/2016, 12:24 PM

## 2016-12-17 NOTE — Discharge Summary (Signed)
Physician Discharge Summary   Patient ID: Jane [name] MRN: [id number] DOB/AGE: 1940/10/07 76 y.o.  Admit date: 12/16/2016 Discharge date: 12-18-2016  Primary Diagnosis:   Admission Diagnoses:  Past Medical History:  Diagnosis Date  . Arthritis   . Depression   . H/O: rheumatic fever    as child  . Heart murmur   . Hx of colonic polyps   . Hypertension    Discharge Diagnoses:   Principal Problem:   OA (osteoarthritis) of knee  Estimated body mass index is 32.19 kg/m as calculated from the following:   Height as of this encounter: 5\' 2"  (1.575 m).   Weight as of this encounter: 79.8 kg (176 lb).  Procedure:  Procedure(s) (LRB): LEFT TOTAL KNEE ARTHROPLASTY (Left)   Consults: None  HPI: Jane [name] is a 75 y.o. year old female with end stage OA of her left knee with progressively worsening pain and dysfunction. She has constant pain, with activity and at rest and significant functional deficits with difficulties even with ADLs. She has had extensive non-op management including analgesics, injections of cortisone and viscosupplements, and home exercise program, but remains in significant pain with significant dysfunction. Radiographs show bone on bone arthritis medial and patellofemoral. She presents now for left Total Knee Arthroplasty.    Laboratory Data: Admission on 12/16/2016  Component Date Value Ref Range Status  . WBC 12/17/2016 12.1* 4.0 - 10.5 K/uL Final  . RBC 12/17/2016 3.73* 3.87 - 5.11 MIL/uL Final  . Hemoglobin 12/17/2016 11.1* 12.0 - 15.0 g/dL Final  . HCT 78/29/5621 32.6* 36.0 - 46.0 % Final  . MCV 12/17/2016 87.4  78.0 - 100.0 fL Final  . MCH 12/17/2016 29.8  26.0 - 34.0 pg Final  . MCHC 12/17/2016 34.0  30.0 - 36.0 g/dL Final  . RDW 30/86/5784 12.5  11.5 - 15.5 % Final  . Platelets 12/17/2016 243  150 - 400 K/uL Final  . Sodium 12/17/2016 134* 135 - 145 mmol/L Final  . Potassium 12/17/2016 4.5  3.5 - 5.1 mmol/L Final  . Chloride 12/17/2016 103  101 -  111 mmol/L Final  . CO2 12/17/2016 23  22 - 32 mmol/L Final  . Glucose, Bld 12/17/2016 174* 65 - 99 mg/dL Final  . BUN 69/62/9528 12  6 - 20 mg/dL Final  . Creatinine, Ser 12/17/2016 0.69  0.44 - 1.00 mg/dL Final  . Calcium 41/32/4401 8.7* 8.9 - 10.3 mg/dL Final  . GFR calc non Af Amer 12/17/2016 >60  >60 mL/min Final  . GFR calc Af Amer 12/17/2016 >60  >60 mL/min Final   Comment: (NOTE) The eGFR has been calculated using the CKD EPI equation. This calculation has not been validated in all clinical situations. eGFR's persistently <60 mL/min signify possible Chronic Kidney Disease.   Eustaquio Boyden gap 12/17/2016 8  5 - 15 Final  Hospital Outpatient Visit on 12/11/2016  Component Date Value Ref Range Status  . aPTT 12/11/2016 41* 24 - 36 seconds Final   Comment:        IF BASELINE aPTT IS ELEVATED, SUGGEST PATIENT RISK ASSESSMENT BE USED TO DETERMINE APPROPRIATE ANTICOAGULANT THERAPY.   . WBC 12/11/2016 7.2  4.0 - 10.5 K/uL Final  . RBC 12/11/2016 4.19  3.87 - 5.11 MIL/uL Final  . Hemoglobin 12/11/2016 12.5  12.0 - 15.0 g/dL Final  . HCT 02/72/5366 36.8  36.0 - 46.0 % Final  . MCV 12/11/2016 87.8  78.0 - 100.0 fL Final  . MCH 12/11/2016 29.8  26.0 -  34.0 pg Final  . MCHC 12/11/2016 34.0  30.0 - 36.0 g/dL Final  . RDW 16/06/9603 12.6  11.5 - 15.5 % Final  . Platelets 12/11/2016 263  150 - 400 K/uL Final  . Sodium 12/11/2016 133* 135 - 145 mmol/L Final  . Potassium 12/11/2016 4.4  3.5 - 5.1 mmol/L Final  . Chloride 12/11/2016 102  101 - 111 mmol/L Final  . CO2 12/11/2016 25  22 - 32 mmol/L Final  . Glucose, Bld 12/11/2016 93  65 - 99 mg/dL Final  . BUN 54/05/8118 24* 6 - 20 mg/dL Final  . Creatinine, Ser 12/11/2016 0.79  0.44 - 1.00 mg/dL Final  . Calcium 14/78/2956 9.1  8.9 - 10.3 mg/dL Final  . Total Protein 12/11/2016 7.2  6.5 - 8.1 g/dL Final  . Albumin 21/30/8657 4.5  3.5 - 5.0 g/dL Final  . AST 84/69/6295 22  15 - 41 U/L Final  . ALT 12/11/2016 22  14 - 54 U/L Final  .  Alkaline Phosphatase 12/11/2016 58  38 - 126 U/L Final  . Total Bilirubin 12/11/2016 0.6  0.3 - 1.2 mg/dL Final  . GFR calc non Af Amer 12/11/2016 >60  >60 mL/min Final  . GFR calc Af Amer 12/11/2016 >60  >60 mL/min Final   Comment: (NOTE) The eGFR has been calculated using the CKD EPI equation. This calculation has not been validated in all clinical situations. eGFR's persistently <60 mL/min signify possible Chronic Kidney Disease.   . Anion gap 12/11/2016 6  5 - 15 Final  . Prothrombin Time 12/11/2016 12.8  11.4 - 15.2 seconds Final  . INR 12/11/2016 0.96   Final  . ABO/RH(D) 12/11/2016 O POS   Final  . Antibody Screen 12/11/2016 NEG   Final  . Sample Expiration 12/11/2016 12/19/2016   Final  . Extend sample reason 12/11/2016 NO TRANSFUSIONS OR PREGNANCY IN THE PAST 3 MONTHS   Final  . MRSA, PCR 12/11/2016 NEGATIVE  NEGATIVE Final  . Staphylococcus aureus 12/11/2016 POSITIVE* NEGATIVE Final   Comment:        The Xpert SA Assay (FDA approved for NASAL specimens in patients over 40 years of age), is one component of a comprehensive surveillance program.  Test performance has been validated by [facility] for patients greater than or equal to 75 year old. It is not intended to diagnose infection nor to guide or monitor treatment.   . ABO/RH(D) 12/11/2016 O POS   Final  Hospital Outpatient Visit on 12/09/2016  Component Date Value Ref Range Status  . Rest HR 12/09/2016 78  bpm Final  . Rest BP 12/09/2016 170/85  mmHg Final  . Peak HR 12/09/2016 111  bpm Final  . Peak BP 12/09/2016 183/69  mmHg Final  . SSS 12/09/2016 21   Final  . SRS 12/09/2016 8   Final  . SDS 12/09/2016 13   Final  . LHR 12/09/2016 0.00   Final  . TID 12/09/2016 1.06   Final  . LV sys vol 12/09/2016 28  mL Final  . LV dias vol 12/09/2016 60  46 - 106 mL Final  Appointment on 11/28/2016  Component Date Value Ref Range Status  . Right CCA prox sys 11/28/2016 154  cm/s Final  . Right CCA prox dias  11/28/2016 32  cm/s Final  . Right cca dist sys 11/28/2016 -184  cm/s Final  . Left CCA prox sys 11/28/2016 88  cm/s Final  . Left CCA prox dias 11/28/2016 17  cm/s  Final  . Left CCA dist sys 11/28/2016 108  cm/s Final  . Left CCA dist dias 11/28/2016 29  cm/s Final  . Left ICA prox sys 11/28/2016 -112  cm/s Final  . Left ICA prox dias 11/28/2016 -29  cm/s Final  . Left ICA dist sys 11/28/2016 -173  cm/s Final  . Left ICA dist dias 11/28/2016 -60  cm/s Final  . RIGHT ECA DIAS 11/28/2016 -23.00  cm/s Final  . RIGHT VERTEBRAL DIAS 11/28/2016 -18.00  cm/s Final  . LEFT ECA DIAS 11/28/2016 -21.00  cm/s Final  . LEFT VERTEBRAL DIAS 11/28/2016 27.00  cm/s Final     X-Rays:Nm Myocar Multi W/spect W/wall Motion / Ef  Result Date: 12/09/2016  The study is normal.  This is a low risk study.  The left ventricular ejection fraction is normal (55-65%).  There was no ST segment deviation noted during stress.  Normal resting and stress perfusion. No ischemia or infarction EF 54%    EKG: Orders placed or performed in visit on 11/28/16  . EKG     Hospital Course: Jane [name] is a 76 y.o. who was admitted to [facility]. They were brought to the operating room on 12/16/2016 and underwent Procedure(s): LEFT TOTAL KNEE ARTHROPLASTY.  Patient tolerated the procedure well and was later transferred to the recovery room and then to the orthopaedic floor for postoperative care.  They were given PO and IV analgesics for pain control following their surgery.  They were given 24 hours of postoperative antibiotics of  Anti-infectives    Start     Dose/Rate Route Frequency Ordered Stop   12/17/16 0600  ceFAZolin (ANCEF) IVPB 2g/100 mL premix     2 g 200 mL/hr over 30 Minutes Intravenous On call to O.R. 12/16/16 1322 12/16/16 1437   12/16/16 2100  ceFAZolin (ANCEF) IVPB 2g/100 mL premix     2 g 200 mL/hr over 30 Minutes Intravenous Every 6 hours 12/16/16 1923 12/17/16 0308   12/16/16 1329  ceFAZolin  (ANCEF) 2-4 GM/100ML-% IVPB    Comments:  Curlene Dolphin   : cabinet override      12/16/16 1329 12/16/16 1437     and started on DVT prophylaxis in the form of Xarelto.   PT and OT were ordered for total joint protocol.  Discharge planning consulted to help with postop disposition and equipment needs.  Patient had a decent night on the evening of surgery.  They started to get up OOB with therapy on day one. Hemovac drain was pulled without difficulty.  Continued to work with therapy into day two.  Dressing was changed on day two and the incision was healing well.  Patient was seen in rounds on POD 2 and was ready to go home.   Diet: Cardiac diet Activity:WBAT Follow-up:in 2 weeks Disposition - Home Discharged Condition: stable   Discharge Instructions    Call MD / Call 911    Complete by:  As directed    If you experience chest pain or shortness of breath, CALL 911 and be transported to the hospital emergency room.  If you develope a fever above 101 F, pus (white drainage) or increased drainage or redness at the wound, or calf pain, call your surgeon's office.   Change dressing    Complete by:  As directed    Change dressing daily with sterile 4 x 4 inch gauze dressing and apply TED hose. Do not submerge the incision under water.   Constipation Prevention  Complete by:  As directed    Drink plenty of fluids.  Prune juice may be helpful.  You may use a stool softener, such as Colace (over the counter) 100 mg twice a day.  Use MiraLax (over the counter) for constipation as needed.   Diet - low sodium heart healthy    Complete by:  As directed    Discharge instructions    Complete by:  As directed    Pick up stool softner and laxative for home use following surgery while on pain medications. Do not submerge incision under water. Please use good hand washing techniques while changing dressing each day. May shower starting three days after surgery. Please use a clean towel to pat the  incision dry following showers. Continue to use ice for pain and swelling after surgery. Do not use any lotions or creams on the incision until instructed by your surgeon.  Wear both TED hose on both legs during the day every day for three weeks, but may have off at night at home.  Postoperative Constipation Protocol  Constipation - defined medically as fewer than three stools per week and severe constipation as less than one stool per week.  One of the most common issues patients have following surgery is constipation.  Even if you have a regular bowel pattern at home, your normal regimen is likely to be disrupted due to multiple reasons following surgery.  Combination of anesthesia, postoperative narcotics, change in appetite and fluid intake all can affect your bowels.  In order to avoid complications following surgery, here are some recommendations in order to help you during your recovery period.  Colace (docusate) - Pick up an over-the-counter form of Colace or another stool softener and take twice a day as long as you are requiring postoperative pain medications.  Take with a full glass of water daily.  If you experience loose stools or diarrhea, hold the colace until you stool forms back up.  If your symptoms do not get better within 1 week or if they get worse, check with your doctor.  Dulcolax (bisacodyl) - Pick up over-the-counter and take as directed by the product packaging as needed to assist with the movement of your bowels.  Take with a full glass of water.  Use this product as needed if not relieved by Colace only.   MiraLax (polyethylene glycol) - Pick up over-the-counter to have on hand.  MiraLax is a solution that will increase the amount of water in your bowels to assist with bowel movements.  Take as directed and can mix with a glass of water, juice, soda, coffee, or tea.  Take if you go more than two days without a movement. Do not use MiraLax more than once per day. Call your  doctor if you are still constipated or irregular after using this medication for 7 days in a row.  If you continue to have problems with postoperative constipation, please contact the office for further assistance and recommendations.  If you experience "the worst abdominal pain ever" or develop nausea or vomiting, please contact the office immediatly for further recommendations for treatment.   Take Xarelto for two and a half more weeks, then discontinue Xarelto. Once the patient has completed the Xarelto, they may resume the 81 mg Aspirin.   Do not put a pillow under the knee. Place it under the heel.    Complete by:  As directed    Do not sit on low chairs, stoools or toilet seats,  as it may be difficult to get up from low surfaces    Complete by:  As directed    Driving restrictions    Complete by:  As directed    No driving until released by the physician.   Increase activity slowly as tolerated    Complete by:  As directed    Lifting restrictions    Complete by:  As directed    No lifting until released by the physician.   Patient may shower    Complete by:  As directed    You may shower without a dressing once there is no drainage.  Do not wash over the wound.  If drainage remains, do not shower until drainage stops.   TED hose    Complete by:  As directed    Use stockings (TED hose) for 3 weeks on both leg(s).  You may remove them at night for sleeping.   Weight bearing as tolerated    Complete by:  As directed      Allergies as of 12/17/2016      Reactions   Ciprofloxacin Hcl    Hand stiffness    Clindamycin/lincomycin    Per patient, give me c-diff   Flagyl [metronidazole] Hives   Questionable if this medication was the cause.   Lodine [etodolac] Other (See Comments)   Stomach pain   Other    Has a very low tolerance to sedation and stimulating drugs    Penicillins Diarrhea, Rash   Has patient had a PCN reaction causing immediate rash, facial/tongue/throat swelling,  SOB or lightheadedness with hypotension: unknown Has patient had a PCN reaction causing severe rash involving mucus membranes or skin necrosis: unknown Has patient had a PCN reaction that required hospitalization unknown Has patient had a PCN reaction occurring within the last 10 years: No If all of the above answers are "NO", then may proceed with Cephalosporin use.      Medication List    STOP taking these medications   aspirin EC 81 MG tablet   calcium carbonate 500 MG chewable tablet Commonly known as:  TUMS - dosed in mg elemental calcium   Calcium/Vitamin D 600-400 MG-UNIT Tabs   diclofenac 50 MG tablet Commonly known as:  CATAFLAM   multivitamins ther. w/minerals Tabs tablet   PROBIOTIC ADVANCED PO   TURMERIC PO   VITAMIN D3 PO     TAKE these medications   acetaminophen 500 MG tablet Commonly known as:  TYLENOL Take 1,000 mg by mouth daily as needed for moderate pain.   citalopram 20 MG tablet Commonly known as:  CELEXA Take 20 mg by mouth at bedtime.   clorazepate 7.5 MG tablet Commonly known as:  TRANXENE Take 3.75 mg by mouth daily as needed for anxiety.   lisinopril 20 MG tablet Commonly known as:  PRINIVIL,ZESTRIL Take 20 mg by mouth 2 (two) times daily.   magnesium oxide 400 MG tablet Commonly known as:  MAG-OX Take 400 mg by mouth daily.   meclizine 25 MG tablet Commonly known as:  ANTIVERT Take 12.5-25 mg by mouth daily as needed for dizziness. .-1/2-1 tablet as needed for vertigo   methocarbamol 500 MG tablet Commonly known as:  ROBAXIN Take 1 tablet (500 mg total) by mouth every 6 (six) hours as needed for muscle spasms.   oxyCODONE 5 MG immediate release tablet Commonly known as:  Oxy IR/ROXICODONE Take 1-2 tablets (5-10 mg total) by mouth every 4 (four) hours as needed for moderate pain or  severe pain.   rivaroxaban 10 MG Tabs tablet Commonly known as:  XARELTO Take 1 tablet (10 mg total) by mouth daily with breakfast. Take Xarelto  for two and a half more weeks following discharge from the hospital, then discontinue Xarelto. Once the patient has completed the Xarelto, they may resume the 81 mg Aspirin. Start taking on:  12/18/2016   simvastatin 20 MG tablet Commonly known as:  ZOCOR Take 20 mg by mouth at bedtime.      Follow-up Information    Loanne Drilling, MD. Schedule an appointment as soon as possible for a visit on 12/31/2016.   Specialty:  Orthopedic Surgery Contact information: [address] [place] [address] [phone]           Signed: Avel Peace, PA-C Orthopaedic Surgery 12/17/2016, 8:31 PM

## 2016-12-17 NOTE — Addendum Note (Signed)
Addendum  created 12/17/16 8144 by Lollie Sails, CRNA   Anesthesia Intra Blocks edited, Charge Capture section accepted, Child order released for a procedure order, Pend clinical note, Sign clinical note

## 2016-12-17 NOTE — Anesthesia Procedure Notes (Signed)
Anesthesia Regional Block: Narrative:       

## 2016-12-17 NOTE — Progress Notes (Signed)
   Subjective: 1 Day Post-Op Procedure(s) (LRB): LEFT TOTAL KNEE ARTHROPLASTY (Left) Patient reports pain as mild.   Patient seen in rounds for Dr. Wynelle Link. Patient is well, but has had some minor complaints of pain in the knee, requiring pain medications We will start therapy today.  Plan is to go Home after hospital stay.  Objective: Vital signs in last 24 hours: Temp:  [97.4 F (36.3 C)-98.6 F (37 C)] 97.5 F (36.4 C) (04/10 0519) Pulse Rate:  [60-104] 79 (04/10 0519) Resp:  [6-18] 16 (04/10 0519) BP: (130-189)/(61-88) 138/67 (04/10 0519) SpO2:  [97 %-100 %] 97 % (04/10 0519) Weight:  [79.8 kg (176 lb)] 79.8 kg (176 lb) (04/09 1333)  Intake/Output from previous day:  Intake/Output Summary (Last 24 hours) at 12/17/16 0830 Last data filed at 12/17/16 0519  Gross per 24 hour  Intake             3545 ml  Output             2840 ml  Net              705 ml    Intake/Output this shift: No intake/output data recorded.  Labs:  Recent Labs  12/17/16 0444  HGB 11.1*    Recent Labs  12/17/16 0444  WBC 12.1*  RBC 3.73*  HCT 32.6*  PLT 243    Recent Labs  12/17/16 0444  NA 134*  K 4.5  CL 103  CO2 23  BUN 12  CREATININE 0.69  GLUCOSE 174*  CALCIUM 8.7*   No results for input(s): LABPT, INR in the last 72 hours.  EXAM General - Patient is Alert, Appropriate and Oriented Extremity - Sensation intact distally Intact pulses distally Dorsiflexion/Plantar flexion intact Incision: dressing C/D/I Dressing - dressing C/D/I Motor Function - intact, moving foot and toes well on exam.  Hemovac pulled without difficulty.  Past Medical History:  Diagnosis Date  . Arthritis   . Depression   . H/O: rheumatic fever    as child  . Heart murmur   . Hx of colonic polyps   . Hypertension     Assessment/Plan: 1 Day Post-Op Procedure(s) (LRB): LEFT TOTAL KNEE ARTHROPLASTY (Left) Principal Problem:   OA (osteoarthritis) of knee  Estimated body mass index is  32.19 kg/m as calculated from the following:   Height as of this encounter: 5\' 2"  (1.575 m).   Weight as of this encounter: 79.8 kg (176 lb). Up with therapy Discharge home - plan straight to outpatient therapy on Friday 12/20/16  DVT Prophylaxis - Xarelto Weight-Bearing as tolerated to left leg D/C O2 and Pulse OX and try on Room Air  Arlee Muslim, PA-C Orthopaedic Surgery 12/17/2016, 8:30 AM

## 2016-12-18 LAB — CBC
HCT: 30.3 % — ABNORMAL LOW (ref 36.0–46.0)
Hemoglobin: 10.3 g/dL — ABNORMAL LOW (ref 12.0–15.0)
MCH: 31 pg (ref 26.0–34.0)
MCHC: 34 g/dL (ref 30.0–36.0)
MCV: 91.3 fL (ref 78.0–100.0)
PLATELETS: 253 10*3/uL (ref 150–400)
RBC: 3.32 MIL/uL — AB (ref 3.87–5.11)
RDW: 13 % (ref 11.5–15.5)
WBC: 17.2 10*3/uL — ABNORMAL HIGH (ref 4.0–10.5)

## 2016-12-18 LAB — BASIC METABOLIC PANEL
ANION GAP: 7 (ref 5–15)
BUN: 19 mg/dL (ref 6–20)
CALCIUM: 9.1 mg/dL (ref 8.9–10.3)
CO2: 27 mmol/L (ref 22–32)
Chloride: 102 mmol/L (ref 101–111)
Creatinine, Ser: 0.7 mg/dL (ref 0.44–1.00)
GFR calc Af Amer: >60 mL/min (ref >60)
GLUCOSE: 113 mg/dL — AB (ref 65–99)
Potassium: 4.4 mmol/L (ref 3.5–5.1)
Sodium: 136 mmol/L (ref 135–145)

## 2016-12-18 NOTE — Progress Notes (Signed)
Physical Therapy Treatment Patient Details Name: Jane [name] MRN: [id number] DOB: April 18, 1941 Today's Date: 12/18/2016    History of Present Illness LEFT TOTAL KNEE ARTHROPLASTY (Left)    PT Comments    POD # 2 Daughter present for "hands on" instruction of all performed listed below.  HEP handout given.  Instructed on proper tech of of her TKR TE's, freq as well as use of ICE.   Pt ready for D/C to home.  Follow Up Recommendations  Outpatient PT     Equipment Recommendations  Rolling walker with 5" wheels (pt has a rollator at home )    Recommendations for Other Services       Precautions / Restrictions Precautions Precautions: Knee Precaution Comments: Reviewed knee precautions Restrictions Weight Bearing Restrictions: No LLE Weight Bearing: Weight bearing as tolerated    Mobility  Bed Mobility Overal bed mobility: Needs Assistance Bed Mobility: Supine to Sit     Supine to sit: Min assist     General bed mobility comments: OOB in recliner  Transfers Overall transfer level: Needs assistance Equipment used: Rolling walker (2 wheeled) Transfers: Sit to/from Stand Sit to Stand: Supervision         General transfer comment: verbal cues for LE placement prior to sit and stand plus hand placement  Ambulation/Gait Ambulation/Gait assistance: Supervision Ambulation Distance (Feet): 55 Feet Assistive device: Rolling walker (2 wheeled) Gait Pattern/deviations: Step-to pattern;Decreased step length - right;Decreased step length - left;Trunk flexed Gait velocity: decreased   General Gait Details: < 25% VCs sequencing and posture, no LOB     instructed family member on safe handling   Stairs Stairs: Yes   Stair Management: One rail Right;Step to pattern;Forwards;With crutches Number of Stairs: 2 General stair comments: with daughter present for "hands on" instruction  Wheelchair Mobility    Modified Rankin (Stroke Patients Only)       Balance  Overall balance assessment: Needs assistance Sitting-balance support: Feet supported Sitting balance-Leahy Scale: Fair     Standing balance support: Bilateral upper extremity supported Standing balance-Leahy Scale: Fair                              Cognition Arousal/Alertness: Awake/alert Behavior During Therapy: WFL for tasks assessed/performed Overall Cognitive Status: Within Functional Limits for tasks assessed                                        Exercises   Total Knee Replacement TE's 10 reps B LE ankle pumps 10 reps towel squeezes 10 reps knee presses 10 reps heel slides  10 reps SAQ's 10 reps SLR's 10 reps ABD Followed by ICE     General Comments        Pertinent Vitals/Pain Pain Assessment: 0-10 Pain Score: 6  Faces Pain Scale: Hurts a little bit Pain Location: L knee Pain Descriptors / Indicators: Operative site guarding;Sore;Tender Pain Intervention(s): Monitored during session;Repositioned;Ice applied    Home Living                      Prior Function            PT Goals (current goals can now be found in the care plan section) Acute Rehab PT Goals Patient Stated Goal: get well Progress towards PT goals: Progressing toward goals    Frequency  7X/week      PT Plan Current plan remains appropriate    Co-evaluation             End of Session Equipment Utilized During Treatment: Gait belt Activity Tolerance: Patient tolerated treatment well Patient left: in chair;with call bell/phone within reach;with family/visitor present Nurse Communication:  (pt requesting Tylenol and ready for D/C after lunch) PT Visit Diagnosis: Muscle weakness (generalized) (M62.81);Difficulty in walking, not elsewhere classified (R26.2);Pain     Time: 4540-9811 PT Time Calculation (min) (ACUTE ONLY): 41 min  Charges:  $Gait Training: 8-22 mins $Therapeutic Exercise: 8-22 mins $Therapeutic Activity: 8-22 mins                     G Codes:       Felecia Shelling  PTA [facility]  Acute  Rehab Pager      [phone]

## 2016-12-18 NOTE — Progress Notes (Signed)
Occupational Therapy Treatment Patient Details Name: Jane [name] MRN: [id number] DOB: [birth year] Today's Date: 12/18/2016    History of present illness LEFT TOTAL KNEE ARTHROPLASTY (Left)   OT comments  Pt progressing towards goals with improved mobility noted; Pt still limited by pain but is able to compensate using AE, assist from others PRN to complete ADLs . Continue per POC.    Follow Up Recommendations  No OT follow up    Equipment Recommendations  None recommended by OT          Precautions / Restrictions Precautions Precautions: Knee Precaution Comments: Reviewed knee precautions Restrictions Weight Bearing Restrictions: No LLE Weight Bearing: Weight bearing as tolerated       Mobility Bed Mobility Overal bed mobility: Needs Assistance Bed Mobility: Supine to Sit     Supine to sit: Min assist     General bed mobility comments: To support LLE  Transfers Overall transfer level: Needs assistance Equipment used: Rolling walker (2 wheeled) Transfers: Sit to/from Stand Sit to Stand: Min guard         General transfer comment: verbal cues for LE placement    Balance Overall balance assessment: Needs assistance Sitting-balance support: Feet supported Sitting balance-Leahy Scale: Fair     Standing balance support: Bilateral upper extremity supported Standing balance-Leahy Scale: Fair                             ADL either performed or assessed with clinical judgement   ADL Overall ADL's : Needs assistance/impaired Eating/Feeding: Set up;Sitting   Grooming: Wash/dry hands;Min guard;Standing           Upper Body Dressing : Set up;Sitting   Lower Body Dressing: Moderate assistance;Sit to/from stand;With adaptive equipment Lower Body Dressing Details (indicate cue type and reason): Reviewed use of AE for LB dressing; MinA for pulling up pants while standing; Pt plans to wear nightgowns mostly while at home Toilet Transfer: Min  guard;BSC;RW;Ambulation (BSC over toilet)   Toileting- Clothing Manipulation and Hygiene: Minimal assistance;Sit to/from stand   Tub/ Shower Transfer: Minimal assistance;Cueing for safety;Cueing for sequencing;Rolling walker;Ambulation;Walk-in Copywriter, advertising Details (indicate cue type and reason): Handout provided for transfer sequence Functional mobility during ADLs: Min guard;Rolling walker General ADL Comments: Pt completed upper and lower body dressing using AE, completed toileting, standing grooming, and shower transfer during session; reviewed knee precautions and handout provided on shower transfer technique                       Cognition Arousal/Alertness: Awake/alert Behavior During Therapy: WFL for tasks assessed/performed Overall Cognitive Status: Within Functional Limits for tasks assessed                                                      General Comments      Pertinent Vitals/ Pain       Pain Assessment: Faces Faces Pain Scale: Hurts a little bit Pain Location: L knee Pain Descriptors / Indicators: Sore Pain Intervention(s): Limited activity within patient's tolerance;Monitored during session;Premedicated before session;Ice applied  Prior Functioning/Environment              Frequency  Min 2X/week        Progress Toward Goals  OT Goals(current goals can now be found in the care plan section)  Progress towards OT goals: Progressing toward goals  Acute Rehab OT Goals Patient Stated Goal: get well OT Goal Formulation: With patient Time For Goal Achievement: 12/31/16 Potential to Achieve Goals: Good  Plan Discharge plan remains appropriate                     End of Session Equipment Utilized During Treatment: Gait belt;Rolling walker  OT Visit Diagnosis: Muscle weakness (generalized) (M62.81);Pain;Unsteadiness on feet (R26.81);Other  abnormalities of gait and mobility (R26.89) Pain - Right/Left: Left Pain - part of body: Knee   Activity Tolerance Patient tolerated treatment well   Patient Left in chair;with call bell/phone within reach;with family/visitor present             Time: 1017-1050 OT Time Calculation (min): 33 min  Charges: OT General Charges $OT Visit: 1 Procedure OT Treatments $Self Care/Home Management : 23-37 mins  Marcy Siren, OT Pager [phone] 12/18/2016    Orlando Penner 12/18/2016, 11:29 AM

## 2016-12-18 NOTE — Progress Notes (Signed)
Discussed discharged instructions with patient and family. Questions asked and answered.  Patient taken to private vehicle in wheelchair with all belongings.

## 2016-12-20 DIAGNOSIS — M25662 Stiffness of left knee, not elsewhere classified: Secondary | ICD-10-CM | POA: Diagnosis not present

## 2016-12-20 DIAGNOSIS — R2689 Other abnormalities of gait and mobility: Secondary | ICD-10-CM | POA: Diagnosis not present

## 2016-12-20 DIAGNOSIS — Z471 Aftercare following joint replacement surgery: Secondary | ICD-10-CM | POA: Diagnosis not present

## 2016-12-23 DIAGNOSIS — M25662 Stiffness of left knee, not elsewhere classified: Secondary | ICD-10-CM | POA: Diagnosis not present

## 2016-12-23 DIAGNOSIS — R2689 Other abnormalities of gait and mobility: Secondary | ICD-10-CM | POA: Diagnosis not present

## 2016-12-23 DIAGNOSIS — Z471 Aftercare following joint replacement surgery: Secondary | ICD-10-CM | POA: Diagnosis not present

## 2016-12-24 ENCOUNTER — Telehealth: Payer: Self-pay | Admitting: *Deleted

## 2016-12-24 DIAGNOSIS — Z09 Encounter for follow-up examination after completed treatment for conditions other than malignant neoplasm: Secondary | ICD-10-CM | POA: Diagnosis not present

## 2016-12-24 DIAGNOSIS — M1990 Unspecified osteoarthritis, unspecified site: Secondary | ICD-10-CM | POA: Diagnosis not present

## 2016-12-24 DIAGNOSIS — I10 Essential (primary) hypertension: Secondary | ICD-10-CM | POA: Diagnosis not present

## 2016-12-24 DIAGNOSIS — Z299 Encounter for prophylactic measures, unspecified: Secondary | ICD-10-CM | POA: Diagnosis not present

## 2016-12-24 DIAGNOSIS — Z96652 Presence of left artificial knee joint: Secondary | ICD-10-CM | POA: Diagnosis not present

## 2016-12-24 NOTE — Telephone Encounter (Signed)
Recall placed

## 2016-12-24 NOTE — Telephone Encounter (Signed)
-----   Message from Arnoldo Lenis, MD sent at 12/20/2016 12:44 PM EDT ----- F/u 6 months  Zandra Abts MD ----- Message ----- From: Massie Maroon, CMA Sent: 12/19/2016   4:17 PM To: Arnoldo Lenis, MD  When did you want to f/u with this pt?  Silus Lanzo

## 2016-12-25 DIAGNOSIS — Z471 Aftercare following joint replacement surgery: Secondary | ICD-10-CM | POA: Diagnosis not present

## 2016-12-25 DIAGNOSIS — R2689 Other abnormalities of gait and mobility: Secondary | ICD-10-CM | POA: Diagnosis not present

## 2016-12-25 DIAGNOSIS — M25662 Stiffness of left knee, not elsewhere classified: Secondary | ICD-10-CM | POA: Diagnosis not present

## 2016-12-27 DIAGNOSIS — Z471 Aftercare following joint replacement surgery: Secondary | ICD-10-CM | POA: Diagnosis not present

## 2016-12-27 DIAGNOSIS — R2689 Other abnormalities of gait and mobility: Secondary | ICD-10-CM | POA: Diagnosis not present

## 2016-12-27 DIAGNOSIS — M25662 Stiffness of left knee, not elsewhere classified: Secondary | ICD-10-CM | POA: Diagnosis not present

## 2016-12-31 DIAGNOSIS — R2689 Other abnormalities of gait and mobility: Secondary | ICD-10-CM | POA: Diagnosis not present

## 2016-12-31 DIAGNOSIS — Z471 Aftercare following joint replacement surgery: Secondary | ICD-10-CM | POA: Diagnosis not present

## 2016-12-31 DIAGNOSIS — M25662 Stiffness of left knee, not elsewhere classified: Secondary | ICD-10-CM | POA: Diagnosis not present

## 2016-12-31 DIAGNOSIS — Z96652 Presence of left artificial knee joint: Secondary | ICD-10-CM | POA: Diagnosis not present

## 2017-01-02 DIAGNOSIS — Z471 Aftercare following joint replacement surgery: Secondary | ICD-10-CM | POA: Diagnosis not present

## 2017-01-02 DIAGNOSIS — M25662 Stiffness of left knee, not elsewhere classified: Secondary | ICD-10-CM | POA: Diagnosis not present

## 2017-01-02 DIAGNOSIS — R2689 Other abnormalities of gait and mobility: Secondary | ICD-10-CM | POA: Diagnosis not present

## 2017-01-03 DIAGNOSIS — M25662 Stiffness of left knee, not elsewhere classified: Secondary | ICD-10-CM | POA: Diagnosis not present

## 2017-01-03 DIAGNOSIS — R2689 Other abnormalities of gait and mobility: Secondary | ICD-10-CM | POA: Diagnosis not present

## 2017-01-03 DIAGNOSIS — Z471 Aftercare following joint replacement surgery: Secondary | ICD-10-CM | POA: Diagnosis not present

## 2017-01-06 DIAGNOSIS — M25662 Stiffness of left knee, not elsewhere classified: Secondary | ICD-10-CM | POA: Diagnosis not present

## 2017-01-06 DIAGNOSIS — Z471 Aftercare following joint replacement surgery: Secondary | ICD-10-CM | POA: Diagnosis not present

## 2017-01-06 DIAGNOSIS — R2689 Other abnormalities of gait and mobility: Secondary | ICD-10-CM | POA: Diagnosis not present

## 2017-01-08 DIAGNOSIS — M25662 Stiffness of left knee, not elsewhere classified: Secondary | ICD-10-CM | POA: Diagnosis not present

## 2017-01-08 DIAGNOSIS — Z471 Aftercare following joint replacement surgery: Secondary | ICD-10-CM | POA: Diagnosis not present

## 2017-01-08 DIAGNOSIS — R2689 Other abnormalities of gait and mobility: Secondary | ICD-10-CM | POA: Diagnosis not present

## 2017-01-10 DIAGNOSIS — M25662 Stiffness of left knee, not elsewhere classified: Secondary | ICD-10-CM | POA: Diagnosis not present

## 2017-01-10 DIAGNOSIS — Z471 Aftercare following joint replacement surgery: Secondary | ICD-10-CM | POA: Diagnosis not present

## 2017-01-10 DIAGNOSIS — R2689 Other abnormalities of gait and mobility: Secondary | ICD-10-CM | POA: Diagnosis not present

## 2017-01-14 DIAGNOSIS — M25662 Stiffness of left knee, not elsewhere classified: Secondary | ICD-10-CM | POA: Diagnosis not present

## 2017-01-14 DIAGNOSIS — R2689 Other abnormalities of gait and mobility: Secondary | ICD-10-CM | POA: Diagnosis not present

## 2017-01-14 DIAGNOSIS — Z471 Aftercare following joint replacement surgery: Secondary | ICD-10-CM | POA: Diagnosis not present

## 2017-01-17 DIAGNOSIS — R2689 Other abnormalities of gait and mobility: Secondary | ICD-10-CM | POA: Diagnosis not present

## 2017-01-17 DIAGNOSIS — Z471 Aftercare following joint replacement surgery: Secondary | ICD-10-CM | POA: Diagnosis not present

## 2017-01-17 DIAGNOSIS — M25662 Stiffness of left knee, not elsewhere classified: Secondary | ICD-10-CM | POA: Diagnosis not present

## 2017-01-21 DIAGNOSIS — Z96652 Presence of left artificial knee joint: Secondary | ICD-10-CM | POA: Diagnosis not present

## 2017-01-21 DIAGNOSIS — Z471 Aftercare following joint replacement surgery: Secondary | ICD-10-CM | POA: Diagnosis not present

## 2017-01-24 DIAGNOSIS — M6281 Muscle weakness (generalized): Secondary | ICD-10-CM | POA: Diagnosis not present

## 2017-01-24 DIAGNOSIS — Z471 Aftercare following joint replacement surgery: Secondary | ICD-10-CM | POA: Diagnosis not present

## 2017-01-24 DIAGNOSIS — R2689 Other abnormalities of gait and mobility: Secondary | ICD-10-CM | POA: Diagnosis not present

## 2017-01-24 DIAGNOSIS — Z96652 Presence of left artificial knee joint: Secondary | ICD-10-CM | POA: Diagnosis not present

## 2017-01-24 DIAGNOSIS — M25662 Stiffness of left knee, not elsewhere classified: Secondary | ICD-10-CM | POA: Diagnosis not present

## 2017-01-28 DIAGNOSIS — M25662 Stiffness of left knee, not elsewhere classified: Secondary | ICD-10-CM | POA: Diagnosis not present

## 2017-01-28 DIAGNOSIS — Z471 Aftercare following joint replacement surgery: Secondary | ICD-10-CM | POA: Diagnosis not present

## 2017-01-28 DIAGNOSIS — R2689 Other abnormalities of gait and mobility: Secondary | ICD-10-CM | POA: Diagnosis not present

## 2017-01-28 DIAGNOSIS — Z96652 Presence of left artificial knee joint: Secondary | ICD-10-CM | POA: Diagnosis not present

## 2017-01-28 DIAGNOSIS — M6281 Muscle weakness (generalized): Secondary | ICD-10-CM | POA: Diagnosis not present

## 2017-02-05 DIAGNOSIS — R2689 Other abnormalities of gait and mobility: Secondary | ICD-10-CM | POA: Diagnosis not present

## 2017-02-05 DIAGNOSIS — M6281 Muscle weakness (generalized): Secondary | ICD-10-CM | POA: Diagnosis not present

## 2017-02-05 DIAGNOSIS — Z96652 Presence of left artificial knee joint: Secondary | ICD-10-CM | POA: Diagnosis not present

## 2017-02-05 DIAGNOSIS — M25662 Stiffness of left knee, not elsewhere classified: Secondary | ICD-10-CM | POA: Diagnosis not present

## 2017-02-05 DIAGNOSIS — Z471 Aftercare following joint replacement surgery: Secondary | ICD-10-CM | POA: Diagnosis not present

## 2017-02-07 DIAGNOSIS — Z471 Aftercare following joint replacement surgery: Secondary | ICD-10-CM | POA: Diagnosis not present

## 2017-02-07 DIAGNOSIS — M6281 Muscle weakness (generalized): Secondary | ICD-10-CM | POA: Diagnosis not present

## 2017-02-07 DIAGNOSIS — M25662 Stiffness of left knee, not elsewhere classified: Secondary | ICD-10-CM | POA: Diagnosis not present

## 2017-02-07 DIAGNOSIS — R2689 Other abnormalities of gait and mobility: Secondary | ICD-10-CM | POA: Diagnosis not present

## 2017-02-07 DIAGNOSIS — Z96652 Presence of left artificial knee joint: Secondary | ICD-10-CM | POA: Diagnosis not present

## 2017-02-10 NOTE — Addendum Note (Signed)
Addendum  created 02/10/17 1301 by Myrtie Soman, MD   Sign clinical note

## 2017-02-13 DIAGNOSIS — Z96652 Presence of left artificial knee joint: Secondary | ICD-10-CM | POA: Diagnosis not present

## 2017-02-13 DIAGNOSIS — Z471 Aftercare following joint replacement surgery: Secondary | ICD-10-CM | POA: Diagnosis not present

## 2017-02-19 DIAGNOSIS — M19049 Primary osteoarthritis, unspecified hand: Secondary | ICD-10-CM | POA: Diagnosis not present

## 2017-02-19 DIAGNOSIS — Z6831 Body mass index (BMI) 31.0-31.9, adult: Secondary | ICD-10-CM | POA: Diagnosis not present

## 2017-02-19 DIAGNOSIS — Z0000 Encounter for general adult medical examination without abnormal findings: Secondary | ICD-10-CM | POA: Diagnosis not present

## 2017-02-19 DIAGNOSIS — Z1389 Encounter for screening for other disorder: Secondary | ICD-10-CM | POA: Diagnosis not present

## 2017-02-19 DIAGNOSIS — I10 Essential (primary) hypertension: Secondary | ICD-10-CM | POA: Diagnosis not present

## 2017-02-19 DIAGNOSIS — R5383 Other fatigue: Secondary | ICD-10-CM | POA: Diagnosis not present

## 2017-02-19 DIAGNOSIS — F332 Major depressive disorder, recurrent severe without psychotic features: Secondary | ICD-10-CM | POA: Diagnosis not present

## 2017-02-19 DIAGNOSIS — E7800 Pure hypercholesterolemia, unspecified: Secondary | ICD-10-CM | POA: Diagnosis not present

## 2017-02-19 DIAGNOSIS — Z1211 Encounter for screening for malignant neoplasm of colon: Secondary | ICD-10-CM | POA: Diagnosis not present

## 2017-02-19 DIAGNOSIS — Z7189 Other specified counseling: Secondary | ICD-10-CM | POA: Diagnosis not present

## 2017-02-19 DIAGNOSIS — Z299 Encounter for prophylactic measures, unspecified: Secondary | ICD-10-CM | POA: Diagnosis not present

## 2017-02-19 DIAGNOSIS — Z79899 Other long term (current) drug therapy: Secondary | ICD-10-CM | POA: Diagnosis not present

## 2017-02-21 DIAGNOSIS — E7800 Pure hypercholesterolemia, unspecified: Secondary | ICD-10-CM | POA: Diagnosis not present

## 2017-02-21 DIAGNOSIS — R5383 Other fatigue: Secondary | ICD-10-CM | POA: Diagnosis not present

## 2017-02-21 DIAGNOSIS — Z79899 Other long term (current) drug therapy: Secondary | ICD-10-CM | POA: Diagnosis not present

## 2017-02-25 DIAGNOSIS — Z471 Aftercare following joint replacement surgery: Secondary | ICD-10-CM | POA: Diagnosis not present

## 2017-02-25 DIAGNOSIS — M1712 Unilateral primary osteoarthritis, left knee: Secondary | ICD-10-CM | POA: Diagnosis not present

## 2017-02-25 DIAGNOSIS — Z96652 Presence of left artificial knee joint: Secondary | ICD-10-CM | POA: Diagnosis not present

## 2017-04-07 DIAGNOSIS — Z1231 Encounter for screening mammogram for malignant neoplasm of breast: Secondary | ICD-10-CM | POA: Diagnosis not present

## 2017-04-21 DIAGNOSIS — M7652 Patellar tendinitis, left knee: Secondary | ICD-10-CM | POA: Diagnosis not present

## 2017-04-21 DIAGNOSIS — M7052 Other bursitis of knee, left knee: Secondary | ICD-10-CM | POA: Diagnosis not present

## 2017-04-21 DIAGNOSIS — Z96652 Presence of left artificial knee joint: Secondary | ICD-10-CM | POA: Diagnosis not present

## 2017-04-30 DIAGNOSIS — M7052 Other bursitis of knee, left knee: Secondary | ICD-10-CM | POA: Diagnosis not present

## 2017-04-30 DIAGNOSIS — M7652 Patellar tendinitis, left knee: Secondary | ICD-10-CM | POA: Diagnosis not present

## 2017-06-05 DIAGNOSIS — M25562 Pain in left knee: Secondary | ICD-10-CM | POA: Diagnosis not present

## 2017-06-05 DIAGNOSIS — G8929 Other chronic pain: Secondary | ICD-10-CM | POA: Diagnosis not present

## 2017-06-06 ENCOUNTER — Encounter (INDEPENDENT_AMBULATORY_CARE_PROVIDER_SITE_OTHER): Payer: Self-pay | Admitting: *Deleted

## 2017-06-13 ENCOUNTER — Other Ambulatory Visit (INDEPENDENT_AMBULATORY_CARE_PROVIDER_SITE_OTHER): Payer: Self-pay | Admitting: *Deleted

## 2017-06-13 DIAGNOSIS — Z800 Family history of malignant neoplasm of digestive organs: Secondary | ICD-10-CM

## 2017-06-13 DIAGNOSIS — Z86010 Personal history of colonic polyps: Secondary | ICD-10-CM | POA: Insufficient documentation

## 2017-06-15 NOTE — Progress Notes (Signed)
Office Visit Note  Patient: Jane [name]             Date of Birth: September 27, 1940           MRN: [id number]             PCP: Ignatius Specking, MD Referring: Ignatius Specking, MD Visit Date: 06/25/2017 Occupation: @GUAROCC @    Subjective:  Pain knees and fatigue.   History of Present Illness: Jane [name] is a 76 y.o. female with history of osteoarthritis and disc disease. She states she had arthroscopic surgery on her left knee joint last year without any relief in her symptoms. As her pain persist she will had left total knee replacement in April 2018. She states she continues to have discomfort in her left knee joint despite having totally replacement. She is also noticed that her arthritis in her hands is getting worse. She is having difficulty bending her left middle finger. She states she had an episode of increased fatigue lasting for 3-4 weeks. The symptoms resolved about 3 weeks ago. Her C-spine is doing okay currently.  Activities of Daily Living:  Patient reports morning stiffness for15 minutes.   Patient Reports nocturnal pain.  Difficulty dressing/grooming: Reports Difficulty climbing stairs: Reports Difficulty getting out of chair: Denies Difficulty using hands for taps, buttons, cutlery, and/or writing: Reports   Review of Systems  Constitutional: Positive for fatigue. Negative for night sweats, weight gain, weight loss and weakness.  HENT: Negative.  Negative for mouth sores, trouble swallowing, trouble swallowing, mouth dryness and nose dryness.   Eyes: Negative.  Negative for pain, redness, visual disturbance and dryness.  Respiratory: Negative.  Negative for cough, shortness of breath and difficulty breathing.   Cardiovascular: Negative.  Negative for chest pain, palpitations, hypertension, irregular heartbeat and swelling in legs/feet.  Gastrointestinal: Negative.  Negative for blood in stool, constipation and diarrhea.  Endocrine: Negative for increased urination.    Genitourinary: Negative for vaginal dryness.  Musculoskeletal: Positive for arthralgias, joint pain, joint swelling and muscle tenderness. Negative for myalgias, muscle weakness, morning stiffness and myalgias.  Skin: Negative for color change, rash, hair loss, skin tightness, ulcers and sensitivity to sunlight.  Allergic/Immunologic: Negative for susceptible to infections.  Neurological: Negative for dizziness, numbness, headaches, memory loss and night sweats.  Hematological: Negative for swollen glands.  Psychiatric/Behavioral: Positive for depressed mood. Negative for sleep disturbance. The patient is not nervous/anxious.     PMFS History:  Patient Active Problem List   Diagnosis Date Noted  . Hx of colonic polyps 06/13/2017  . Family history of colon cancer 06/13/2017  . OA (osteoarthritis) of knee 12/16/2016    Past Medical History:  Diagnosis Date  . Arthritis   . Depression   . H/O: rheumatic fever    as child  . Heart murmur   . Hx of colonic polyps   . Hypertension     No family history on file. Past Surgical History:  Procedure Laterality Date  . CARDIAC CATHETERIZATION    . COLONOSCOPY  06/03/2012   Procedure: COLONOSCOPY;  Surgeon: Malissa Hippo, MD;  Location: [facility];  Service: Endoscopy;  Laterality: N/A;  1200  . FACIAL COSMETIC SURGERY    . RHINOPLASTY    . TOE SURGERY    . TONSILLECTOMY    . TOTAL KNEE ARTHROPLASTY Left 12/16/2016   Procedure: LEFT TOTAL KNEE ARTHROPLASTY;  Surgeon: Ollen Gross, MD;  Location: [facility];  Service: Orthopedics;  Laterality: Left;  requests   . TUBAL LIGATION     Social History   Social History Narrative  . No narrative on file     Objective: Vital Signs: BP (!) 159/71 (BP Location: Left Arm, Patient Position: Sitting, Cuff Size: Normal)   Pulse 76   Ht 5\' 2"  (1.575 m)   Wt 175 lb (79.4 kg)   BMI 32.01 kg/m    Physical Exam  Constitutional: She is oriented to person, place, and time. She appears  well-developed and well-nourished.  HENT:  Head: Normocephalic and atraumatic.  Eyes: Conjunctivae and EOM are normal.  Neck: Normal range of motion.  Cardiovascular: Normal rate, regular rhythm, normal heart sounds and intact distal pulses.   Pulmonary/Chest: Effort normal and breath sounds normal.  Abdominal: Soft. Bowel sounds are normal.  Lymphadenopathy:    She has no cervical adenopathy.  Neurological: She is alert and oriented to person, place, and time.  Skin: Skin is warm and dry. Capillary refill takes less than 2 seconds.  Psychiatric: She has a normal mood and affect. Her behavior is normal.  Nursing note and vitals reviewed.    Musculoskeletal Exam: C-spine good range of motion. Shoulder joints elbow joints are good range of motion. Shoulder joints although joints wrist joints with good range of motion. She had no swelling or synovitis over MCPs. She has PIP/DIP thickening bilaterally with incomplete fist formation. States she is swelling on palpation over her left third PIP joint.. She has good range of motion in her hip joints. Her left knee joint has been replaced with limited range of motion and warmth. Right knee joint and good range of motion.  CDAI Exam: No CDAI exam completed.    Investigation: No additional findings.   Imaging: Xr Hand 2 View Left  Result Date: 06/25/2017 All PIP DIP severe narrowing and CMC severe narrowing noted. No MCP narrowing noted. No intercarpal joint space narrowing noted. No erosive changes were noted. Subluxation of left third PIP joint and soft tissue swelling was noted. Impression: These findings are consistent with severe osteoarthritis of the hand.  Xr Hand 2 View Right  Result Date: 06/25/2017 All PIP/DIP and CMC joints severe narrowing was noted. No MCP joint narrowing was noted. These findings are consistent with severe osteoarthritis of the hand.   Speciality Comments: No specialty comments available.    Procedures:    Small Joint Inj Date/Time: 06/25/2017 2:17 PM Performed by: Pollyann Savoy Authorized by: Pollyann Savoy   Consent Given by:  Parent and patient Site marked: the procedure site was marked   Timeout: prior to procedure the correct patient, procedure, and site was verified   Indications:  Joint swelling and pain Location:  Long finger Site:  L long PIP Needle Size:  27 G Approach:  Dorsal Ultrasound Guided: Yes   Fluoroscopic Guidance: No   Medications:  10 mg triamcinolone acetonide 40 MG/ML Aspiration Attempted: Yes   Aspirate amount (mL):  0 Patient tolerance:  Patient tolerated the procedure well with no immediate complications   Allergies: Ciprofloxacin hcl; Clindamycin/lincomycin; Flagyl [metronidazole]; Lodine [etodolac]; Other; and Penicillins   Assessment / Plan:     Visit Diagnoses: Primary osteoarthritis of both hands - she's been having history increased pain and swelling and stiffness in her hands. She is incomplete fist formation bilaterally with decreased grip strength. She had severe pain and discomfort in left third PIP joint which is swollen. After different treatment options were discussed left third PIP joint was injected with cortisone under ultrasound guidance. Lidocaine  was not used as she states that she's allergic to lidocaine. A splint was applied to be used for the next 3 days. Plan: XR Hand 2 View Right, XR Hand 2 View Left  Primary osteoarthritis of right knee: She is having some discomfort.  H/O total knee replacement, left - 12/16/2016 by Dr. Despina Hick. She is ongoing pain and discomfort despite having knee replacement.  Primary osteoarthritis of both feet - calcaneal spurs. Proper fitting shoes were discussed.  DDD (degenerative disc disease), cervical - per pt.: Her C-spine discomfort is better.   Age-related osteoporosis without current pathological fracture: Patient reports her most recent bone density showed T score of -2.5. At this point she  is not ready to start on any medication. Use of calcium, vitamin D and resistive exercises was discussed.  Her other medical problems are listed as follows:  History of hypertension  History of TIA (transient ischemic attack)  History of hypercholesterolemia  History of colon polyps  History of depression    Orders: Orders Placed This Encounter  Procedures  . Small Joint Injection/Arthrocentesis  . XR Hand 2 View Right  . XR Hand 2 View Left   Meds ordered this encounter  Medications  . diclofenac sodium (VOLTAREN) 1 % GEL    Sig: Apply to affected area 3-4 times daily PRN    Dispense:  3 Tube    Refill:  3    Face-to-face time spent with patient was 30 minutes. Greater than 50% of time was spent in counseling and coordination of care.  Follow-Up Instructions: Return in about 6 months (around 12/24/2017) for OA, DDD.   Pollyann Savoy, MD  Note - This record has been created using Animal nutritionist.  Chart creation errors have been sought, but may not always  have been located. Such creation errors do not reflect on  the standard of medical care.

## 2017-06-25 ENCOUNTER — Other Ambulatory Visit: Payer: Self-pay | Admitting: *Deleted

## 2017-06-25 ENCOUNTER — Ambulatory Visit (INDEPENDENT_AMBULATORY_CARE_PROVIDER_SITE_OTHER): Payer: Medicare Other

## 2017-06-25 ENCOUNTER — Ambulatory Visit (INDEPENDENT_AMBULATORY_CARE_PROVIDER_SITE_OTHER): Payer: Self-pay

## 2017-06-25 ENCOUNTER — Encounter: Payer: Self-pay | Admitting: Rheumatology

## 2017-06-25 ENCOUNTER — Ambulatory Visit (INDEPENDENT_AMBULATORY_CARE_PROVIDER_SITE_OTHER): Payer: Medicare Other | Admitting: Rheumatology

## 2017-06-25 VITALS — BP 159/71 | HR 76 | Ht 62.0 in | Wt 175.0 lb

## 2017-06-25 DIAGNOSIS — M19071 Primary osteoarthritis, right ankle and foot: Secondary | ICD-10-CM | POA: Diagnosis not present

## 2017-06-25 DIAGNOSIS — M810 Age-related osteoporosis without current pathological fracture: Secondary | ICD-10-CM | POA: Diagnosis not present

## 2017-06-25 DIAGNOSIS — Z8679 Personal history of other diseases of the circulatory system: Secondary | ICD-10-CM | POA: Diagnosis not present

## 2017-06-25 DIAGNOSIS — M19072 Primary osteoarthritis, left ankle and foot: Secondary | ICD-10-CM | POA: Diagnosis not present

## 2017-06-25 DIAGNOSIS — Z8659 Personal history of other mental and behavioral disorders: Secondary | ICD-10-CM | POA: Diagnosis not present

## 2017-06-25 DIAGNOSIS — M5030 Other cervical disc degeneration, unspecified cervical region: Secondary | ICD-10-CM | POA: Diagnosis not present

## 2017-06-25 DIAGNOSIS — M19042 Primary osteoarthritis, left hand: Secondary | ICD-10-CM

## 2017-06-25 DIAGNOSIS — Z96652 Presence of left artificial knee joint: Secondary | ICD-10-CM

## 2017-06-25 DIAGNOSIS — Z8639 Personal history of other endocrine, nutritional and metabolic disease: Secondary | ICD-10-CM | POA: Diagnosis not present

## 2017-06-25 DIAGNOSIS — M1711 Unilateral primary osteoarthritis, right knee: Secondary | ICD-10-CM

## 2017-06-25 DIAGNOSIS — M19041 Primary osteoarthritis, right hand: Secondary | ICD-10-CM | POA: Diagnosis not present

## 2017-06-25 DIAGNOSIS — Z8673 Personal history of transient ischemic attack (TIA), and cerebral infarction without residual deficits: Secondary | ICD-10-CM | POA: Diagnosis not present

## 2017-06-25 DIAGNOSIS — Z86010 Personal history of colonic polyps: Secondary | ICD-10-CM

## 2017-06-25 MED ORDER — TRIAMCINOLONE ACETONIDE 40 MG/ML IJ SUSP
10.0000 mg | INTRAMUSCULAR | Status: AC | PRN
  Administered 2017-06-25: 10 mg via INTRA_ARTICULAR

## 2017-06-25 MED ORDER — DICLOFENAC SODIUM 1 % TD GEL: TRANSDERMAL | 3 refills | Status: DC

## 2017-06-25 NOTE — Patient Instructions (Signed)
Natural anti-inflammatories  You can purchase these at [employer], [employer] or online.  . Turmeric (capsules)  . Ginger (ginger root or capsules)  . Omega 3 (Fish, flax seeds, chia seeds, walnuts, almonds)  . Tart cherry (dried or extract)   Patient should be under the care of a physician while taking these supplements. This may not be reproduced without the permission of Dr. Raistlin Gum.  

## 2017-07-01 ENCOUNTER — Encounter: Payer: Self-pay | Admitting: Cardiology

## 2017-07-01 ENCOUNTER — Ambulatory Visit (INDEPENDENT_AMBULATORY_CARE_PROVIDER_SITE_OTHER): Payer: Medicare Other | Admitting: Cardiology

## 2017-07-01 ENCOUNTER — Encounter: Payer: Self-pay | Admitting: *Deleted

## 2017-07-01 VITALS — BP 118/74 | HR 71 | Ht 62.0 in | Wt 172.6 lb

## 2017-07-01 DIAGNOSIS — I38 Endocarditis, valve unspecified: Secondary | ICD-10-CM | POA: Diagnosis not present

## 2017-07-01 DIAGNOSIS — I447 Left bundle-branch block, unspecified: Secondary | ICD-10-CM | POA: Diagnosis not present

## 2017-07-01 DIAGNOSIS — E669 Obesity, unspecified: Secondary | ICD-10-CM | POA: Diagnosis not present

## 2017-07-01 NOTE — Progress Notes (Signed)
Clinical Summary Jane [name] is a 76 y.o.female seen today for follow up of the following medical problems.    1. Chronic LBBB - reports history 20 years of LBBB, she reports iniitally LBBB was rate related. Diagnosed in 1996, at that time rates 160-170 on treadmill stress she developed LBBB - heart cath 2005 was normal  - 11/2016 echo LVEF 55-60%.  - 12/2016 nuclear stress test no ischemia - no reent chest pain, no SOB or DOE - compliant with meds.    2. Valvular heart disease - 11/2016 echo mild to mod MR, moderate TR - no recent symptoms   Past Medical History:  Diagnosis Date  . Arthritis   . Depression   . H/O: rheumatic fever    as child  . Heart murmur   . Hx of colonic polyps   . Hypertension      Allergies  Allergen Reactions  . Ciprofloxacin Hcl     Hand stiffness   . Clindamycin/Lincomycin     Per patient, give me c-diff  . Flagyl [Metronidazole] Hives    Questionable if this medication was the cause.  . Lodine [Etodolac] Other (See Comments)    Stomach pain   . Other     Has a very low tolerance to sedation and stimulating drugs   . Penicillins Diarrhea and Rash    Has patient had a PCN reaction causing immediate rash, facial/tongue/throat swelling, SOB or lightheadedness with hypotension: unknown Has patient had a PCN reaction causing severe rash involving mucus membranes or skin necrosis: unknown Has patient had a PCN reaction that required hospitalization unknown Has patient had a PCN reaction occurring within the last 10 years: No If all of the above answers are "NO", then may proceed with Cephalosporin use.      Current Outpatient Prescriptions  Medication Sig Dispense Refill  . acetaminophen (TYLENOL) 500 MG tablet Take 1,000 mg by mouth daily as needed for moderate pain.     . citalopram (CELEXA) 20 MG tablet Take 20 mg by mouth at bedtime.    . diclofenac sodium (VOLTAREN) 1 % GEL Apply to affected area 3-4 times daily PRN 3 Tube 3  .  lisinopril (PRINIVIL,ZESTRIL) 20 MG tablet Take 20 mg by mouth 2 (two) times daily.    . magnesium oxide (MAG-OX) 400 MG tablet Take 400 mg by mouth daily.    . meclizine (ANTIVERT) 25 MG tablet Take 12.5-25 mg by mouth daily as needed for dizziness. .-1/2-1 tablet as needed for vertigo     . simvastatin (ZOCOR) 20 MG tablet Take 20 mg by mouth at bedtime.     No current facility-administered medications for this visit.      Past Surgical History:  Procedure Laterality Date  . CARDIAC CATHETERIZATION    . COLONOSCOPY  06/03/2012   Procedure: COLONOSCOPY;  Surgeon: Rogene Houston, MD;  Location: [facility];  Service: Endoscopy;  Laterality: N/A;  1200  . FACIAL COSMETIC SURGERY    . RHINOPLASTY    . TOE SURGERY    . TONSILLECTOMY    . TOTAL KNEE ARTHROPLASTY Left 12/16/2016   Procedure: LEFT TOTAL KNEE ARTHROPLASTY;  Surgeon: Gaynelle Arabian, MD;  Location: [facility];  Service: Orthopedics;  Laterality: Left;  requests 46mins  . TUBAL LIGATION       Allergies  Allergen Reactions  . Ciprofloxacin Hcl     Hand stiffness   . Clindamycin/Lincomycin     Per patient, give me c-diff  .  Flagyl [Metronidazole] Hives    Questionable if this medication was the cause.  . Lodine [Etodolac] Other (See Comments)    Stomach pain   . Other     Has a very low tolerance to sedation and stimulating drugs   . Penicillins Diarrhea and Rash    Has patient had a PCN reaction causing immediate rash, facial/tongue/throat swelling, SOB or lightheadedness with hypotension: unknown Has patient had a PCN reaction causing severe rash involving mucus membranes or skin necrosis: unknown Has patient had a PCN reaction that required hospitalization unknown Has patient had a PCN reaction occurring within the last 10 years: No If all of the above answers are "NO", then may proceed with Cephalosporin use.       No family history on file.   Social History Jane [name] reports that she has never smoked. She has  never used smokeless tobacco. Jane [name] reports that she drinks alcohol.   Review of Systems CONSTITUTIONAL: No weight loss, fever, chills, weakness or fatigue.  HEENT: Eyes: No visual loss, blurred vision, double vision or yellow sclerae.No hearing loss, sneezing, congestion, runny nose or sore throat.  SKIN: No rash or itching.  CARDIOVASCULAR: per hpi RESPIRATORY: No shortness of breath, cough or sputum.  GASTROINTESTINAL: No anorexia, nausea, vomiting or diarrhea. No abdominal pain or blood.  GENITOURINARY: No burning on urination, no polyuria NEUROLOGICAL: No headache, dizziness, syncope, paralysis, ataxia, numbness or tingling in the extremities. No change in bowel or bladder control.  MUSCULOSKELETAL: No muscle, back pain, joint pain or stiffness.  LYMPHATICS: No enlarged nodes. No history of splenectomy.  PSYCHIATRIC: No history of depression or anxiety.  ENDOCRINOLOGIC: No reports of sweating, cold or heat intolerance. No polyuria or polydipsia.  Marland Kitchen   Physical Examination Vitals:   07/01/17 1404  BP: 118/74  [name]: 71  SpO2: 97%   Vitals:   07/01/17 1404  Weight: 172 lb 9.6 oz (78.3 kg)  Height: 5\' 2"  (1.575 m)    Gen: resting comfortably, no acute distress HEENT: no scleral icterus, pupils equal round and reactive, no palptable cervical adenopathy,  CV: RRR, 2/6 systolic murmur at apex, no jvd Resp: Clear to auscultation bilaterally GI: abdomen is soft, non-tender, non-distended, normal bowel sounds, no hepatosplenomegaly MSK: extremities are warm, no edema.  Skin: warm, no rash Neuro:  no focal deficits Psych: appropriate affect   Diagnostic Studies 11/2016 echo [facility] LVEF 55-60%,   12/2016 Nuclear stress  The study is normal.  This is a low risk study.  The left ventricular ejection fraction is normal (55-65%).  There was no ST segment deviation noted during stress.   Normal resting and stress perfusion. No ischemia or infarction EF 54%    Assessment and Plan  1.Chronic LBBB - no evidence of underlying significant heart disease - no further workup at this time.   2. Valvular heart disease - mild to mod MR, mod TR by last echo - no significant symptoms -continue to monitor.   3. Obesity - refer to nutrition  F/u 1 year      Arnoldo Lenis, M.D

## 2017-07-01 NOTE — Patient Instructions (Signed)
Your physician wants you to follow-up in: [place] will receive a reminder letter in the mail two months in advance. If you don't receive a letter, please call our office to schedule the follow-up appointment.  Your physician recommends that you continue on your current medications as directed. Please refer to the Current Medication list given to you today.  You have been referred to NUTRITION   Thank you for choosing [facility]!!

## 2017-07-04 DIAGNOSIS — Z23 Encounter for immunization: Secondary | ICD-10-CM | POA: Diagnosis not present

## 2017-07-16 DIAGNOSIS — F332 Major depressive disorder, recurrent severe without psychotic features: Secondary | ICD-10-CM | POA: Diagnosis not present

## 2017-07-16 DIAGNOSIS — J069 Acute upper respiratory infection, unspecified: Secondary | ICD-10-CM | POA: Diagnosis not present

## 2017-07-16 DIAGNOSIS — Z299 Encounter for prophylactic measures, unspecified: Secondary | ICD-10-CM | POA: Diagnosis not present

## 2017-07-16 DIAGNOSIS — Z713 Dietary counseling and surveillance: Secondary | ICD-10-CM | POA: Diagnosis not present

## 2017-07-16 DIAGNOSIS — Z6832 Body mass index (BMI) 32.0-32.9, adult: Secondary | ICD-10-CM | POA: Diagnosis not present

## 2017-07-28 ENCOUNTER — Telehealth (INDEPENDENT_AMBULATORY_CARE_PROVIDER_SITE_OTHER): Payer: Self-pay

## 2017-07-28 MED ORDER — DICLOFENAC SODIUM 1 % TD GEL: TRANSDERMAL | 3 refills | Status: AC

## 2017-07-28 NOTE — Telephone Encounter (Signed)
Patient would like a Rx refill on Voltaren Gel.  Cb# is [phone].  Please advise.  Thank you.

## 2017-07-28 NOTE — Addendum Note (Signed)
Addended by: Carole Binning on: 07/28/2017 01:58 PM   Modules accepted: Orders

## 2017-07-28 NOTE — Telephone Encounter (Signed)
Last Visit: 06/25/17 Next Visit: 06/25/18  Okay to refill per Dr. Estanislado Pandy

## 2017-07-30 ENCOUNTER — Encounter: Payer: Medicare Other | Attending: Cardiology | Admitting: Nutrition

## 2017-07-30 ENCOUNTER — Encounter: Payer: Self-pay | Admitting: Nutrition

## 2017-07-30 ENCOUNTER — Ambulatory Visit: Payer: Medicare Other | Admitting: Nutrition

## 2017-07-30 VITALS — Ht 62.0 in | Wt 174.0 lb

## 2017-07-30 DIAGNOSIS — E669 Obesity, unspecified: Secondary | ICD-10-CM

## 2017-07-30 NOTE — Patient Instructions (Addendum)
Goals Follow My Plate Eat meals on time Eat 2-3 carb choices per meal Drink 84 oz of water per day Exercise  30 minutes a day Increase fresh fruits and vegetable Lose 2 lbs per month  Keep a food journal

## 2017-07-30 NOTE — Progress Notes (Signed)
  Medical Nutrition Therapy:  Appt start time: 1130 end time:  1230   Assessment:  Primary concerns today: Overweight. [place]. Eats meal out often. Eats 2-3 meals per day or just snacks thorough out the day. Had knee surgeries 1 yr ago and that set her back with physical activity.  Has had some mild depression.  Wants to lose weight down to 140-150's. Current diet is inconisent and higher in fat, and sodium an dlow in fresh fruits and vegetables and fiber. Impaired fasting glucose levels.  CMP Latest Ref Rng & Units 12/18/2016 12/17/2016 12/11/2016  Glucose 65 - 99 mg/dL 113(H) 174(H) 93  BUN 6 - 20 mg/dL 19 12 24(H)  Creatinine 0.44 - 1.00 mg/dL 0.70 0.69 0.79  Sodium 135 - 145 mmol/L 136 134(L) 133(L)  Potassium 3.5 - 5.1 mmol/L 4.4 4.5 4.4  Chloride 101 - 111 mmol/L 102 103 102  CO2 22 - 32 mmol/L 27 23 25   Calcium 8.9 - 10.3 mg/dL 9.1 8.7(L) 9.1  Total Protein 6.5 - 8.1 g/dL - - 7.2  Total Bilirubin 0.3 - 1.2 mg/dL - - 0.6  Alkaline Phos 38 - 126 U/L - - 58  AST 15 - 41 U/L - - 22  ALT 14 - 54 U/L - - 22    Preferred Learning Style:   No preference indicated   Learning Readiness:  Ready  Change in progress   MEDICATIONS: See list   DIETARY INTAKE:  24-hr recall:  B ( AM): shake-protein powder, spinach, walnuts, 1/2 apple, 1/3  banana, 1/2 green yogurt, chia seeds, cinnamon and dark coco powder, pb powder , water Snk ( AM):  L ( PM): pb crackers 8, water, apple 1/2 Snk ( PM):   D ( PM): snacking Snk ( PM):  Beverages: water- 80 oz.  Usual physical activity: ADL  Estimated energy needs: 1200  calories 135 g carbohydrates 90 g protein 33 g fat  Progress Towards Goal(s):  In progress.   Nutritional Diagnosis:  NB-1.1 Food and nutrition-related knowledge deficit As related to Overweight As evidenced by BMI 31. .   Goals Follow My Plate Eat meals on time Eat 2-3 carb choices per meal Drink 84 oz of water per day Exercise  30 minutes a day Increase fresh  fruits and vegetable Lose 2 lbs per month  Keep a food journal  Intervention:  Heart Healthy Eating, My Plate, portion sizes, meal planning, avoiding snacks, increased physical activity.  Teaching Method Utilized:  Visual Auditory Hands on  Handouts given during visit include:  The Plate Method  Meal Plan  Barriers to learning/adherence to lifestyle change: none  Demonstrated degree of understanding via:  Teach Back   Monitoring/Evaluation:  Dietary intake, exercise, meal planning, and body weight in 2 month(s). Would recommend to check an A1C due to family history and impaired fasting glucose

## 2017-08-06 DIAGNOSIS — H2513 Age-related nuclear cataract, bilateral: Secondary | ICD-10-CM | POA: Diagnosis not present

## 2017-08-13 ENCOUNTER — Encounter (INDEPENDENT_AMBULATORY_CARE_PROVIDER_SITE_OTHER): Payer: Self-pay | Admitting: *Deleted

## 2017-08-13 ENCOUNTER — Telehealth (INDEPENDENT_AMBULATORY_CARE_PROVIDER_SITE_OTHER): Payer: Self-pay | Admitting: *Deleted

## 2017-08-13 NOTE — Telephone Encounter (Signed)
Patient needs trilyte 

## 2017-08-14 MED ORDER — PEG 3350-KCL-NA BICARB-NACL 420 G PO SOLR: 4000.0000 mL | Freq: Once | ORAL | 0 refills | Status: AC

## 2017-08-25 ENCOUNTER — Telehealth (INDEPENDENT_AMBULATORY_CARE_PROVIDER_SITE_OTHER): Payer: Self-pay | Admitting: *Deleted

## 2017-08-25 NOTE — Telephone Encounter (Signed)
agree

## 2017-08-25 NOTE — Telephone Encounter (Signed)
Referring MD/PCP: vyas   Procedure: tcs  Reason/Indication:  Hx polyps, fam hx colon ca  Has patient had this procedure before?  Yes, 2013  If so, when, by whom and where?    Is there a family history of colon cancer?  Yes, mother  Who?  What age when diagnosed?    Is patient diabetic?   no      Does patient have prosthetic heart valve or mechanical valve?  no  Do you have a pacemaker?  no  Has patient ever had endocarditis? no  Has patient had joint replacement within last 12 months?  Yes, 6 months ago  Is patient constipated or take laxatives? no  Does patient have a history of alcohol/drug use?  no  Is patient on Coumadin, Plavix and/or Aspirin? yes  Medications: asa 81 mg daily, lisinopril 20 mg bid, simvastatin 20 mg daily, citalopram 20 mg daily, tylenol arthritis daily, meclizine prn  Allergies: see epic  Medication Adjustment per Dr Laural Golden: asa 2 days  Procedure date & time: 09/24/17 at 830

## 2017-08-29 DIAGNOSIS — M47816 Spondylosis without myelopathy or radiculopathy, lumbar region: Secondary | ICD-10-CM | POA: Diagnosis not present

## 2017-08-29 DIAGNOSIS — M47812 Spondylosis without myelopathy or radiculopathy, cervical region: Secondary | ICD-10-CM | POA: Diagnosis not present

## 2017-08-29 DIAGNOSIS — M9902 Segmental and somatic dysfunction of thoracic region: Secondary | ICD-10-CM | POA: Diagnosis not present

## 2017-08-29 DIAGNOSIS — M546 Pain in thoracic spine: Secondary | ICD-10-CM | POA: Diagnosis not present

## 2017-08-29 DIAGNOSIS — M9903 Segmental and somatic dysfunction of lumbar region: Secondary | ICD-10-CM | POA: Diagnosis not present

## 2017-08-29 DIAGNOSIS — M9901 Segmental and somatic dysfunction of cervical region: Secondary | ICD-10-CM | POA: Diagnosis not present

## 2017-09-05 DIAGNOSIS — M9902 Segmental and somatic dysfunction of thoracic region: Secondary | ICD-10-CM | POA: Diagnosis not present

## 2017-09-05 DIAGNOSIS — M546 Pain in thoracic spine: Secondary | ICD-10-CM | POA: Diagnosis not present

## 2017-09-05 DIAGNOSIS — M47812 Spondylosis without myelopathy or radiculopathy, cervical region: Secondary | ICD-10-CM | POA: Diagnosis not present

## 2017-09-05 DIAGNOSIS — M47816 Spondylosis without myelopathy or radiculopathy, lumbar region: Secondary | ICD-10-CM | POA: Diagnosis not present

## 2017-09-05 DIAGNOSIS — M9901 Segmental and somatic dysfunction of cervical region: Secondary | ICD-10-CM | POA: Diagnosis not present

## 2017-09-05 DIAGNOSIS — M9903 Segmental and somatic dysfunction of lumbar region: Secondary | ICD-10-CM | POA: Diagnosis not present

## 2017-09-10 DIAGNOSIS — M47816 Spondylosis without myelopathy or radiculopathy, lumbar region: Secondary | ICD-10-CM | POA: Diagnosis not present

## 2017-09-10 DIAGNOSIS — M9902 Segmental and somatic dysfunction of thoracic region: Secondary | ICD-10-CM | POA: Diagnosis not present

## 2017-09-10 DIAGNOSIS — M546 Pain in thoracic spine: Secondary | ICD-10-CM | POA: Diagnosis not present

## 2017-09-10 DIAGNOSIS — M9901 Segmental and somatic dysfunction of cervical region: Secondary | ICD-10-CM | POA: Diagnosis not present

## 2017-09-10 DIAGNOSIS — M9903 Segmental and somatic dysfunction of lumbar region: Secondary | ICD-10-CM | POA: Diagnosis not present

## 2017-09-10 DIAGNOSIS — M47812 Spondylosis without myelopathy or radiculopathy, cervical region: Secondary | ICD-10-CM | POA: Diagnosis not present

## 2017-09-15 DIAGNOSIS — M9902 Segmental and somatic dysfunction of thoracic region: Secondary | ICD-10-CM | POA: Diagnosis not present

## 2017-09-15 DIAGNOSIS — M9901 Segmental and somatic dysfunction of cervical region: Secondary | ICD-10-CM | POA: Diagnosis not present

## 2017-09-15 DIAGNOSIS — M47816 Spondylosis without myelopathy or radiculopathy, lumbar region: Secondary | ICD-10-CM | POA: Diagnosis not present

## 2017-09-15 DIAGNOSIS — M9903 Segmental and somatic dysfunction of lumbar region: Secondary | ICD-10-CM | POA: Diagnosis not present

## 2017-09-15 DIAGNOSIS — M546 Pain in thoracic spine: Secondary | ICD-10-CM | POA: Diagnosis not present

## 2017-09-15 DIAGNOSIS — M47812 Spondylosis without myelopathy or radiculopathy, cervical region: Secondary | ICD-10-CM | POA: Diagnosis not present

## 2017-09-19 DIAGNOSIS — M47816 Spondylosis without myelopathy or radiculopathy, lumbar region: Secondary | ICD-10-CM | POA: Diagnosis not present

## 2017-09-19 DIAGNOSIS — M9902 Segmental and somatic dysfunction of thoracic region: Secondary | ICD-10-CM | POA: Diagnosis not present

## 2017-09-19 DIAGNOSIS — M9903 Segmental and somatic dysfunction of lumbar region: Secondary | ICD-10-CM | POA: Diagnosis not present

## 2017-09-19 DIAGNOSIS — M546 Pain in thoracic spine: Secondary | ICD-10-CM | POA: Diagnosis not present

## 2017-09-19 DIAGNOSIS — M9901 Segmental and somatic dysfunction of cervical region: Secondary | ICD-10-CM | POA: Diagnosis not present

## 2017-09-19 DIAGNOSIS — M47812 Spondylosis without myelopathy or radiculopathy, cervical region: Secondary | ICD-10-CM | POA: Diagnosis not present

## 2017-09-23 ENCOUNTER — Ambulatory Visit: Payer: Medicare Other | Admitting: Nutrition

## 2017-09-23 DIAGNOSIS — M47812 Spondylosis without myelopathy or radiculopathy, cervical region: Secondary | ICD-10-CM | POA: Diagnosis not present

## 2017-09-23 DIAGNOSIS — M546 Pain in thoracic spine: Secondary | ICD-10-CM | POA: Diagnosis not present

## 2017-09-23 DIAGNOSIS — M9903 Segmental and somatic dysfunction of lumbar region: Secondary | ICD-10-CM | POA: Diagnosis not present

## 2017-09-23 DIAGNOSIS — M9901 Segmental and somatic dysfunction of cervical region: Secondary | ICD-10-CM | POA: Diagnosis not present

## 2017-09-23 DIAGNOSIS — M9902 Segmental and somatic dysfunction of thoracic region: Secondary | ICD-10-CM | POA: Diagnosis not present

## 2017-09-23 DIAGNOSIS — M47816 Spondylosis without myelopathy or radiculopathy, lumbar region: Secondary | ICD-10-CM | POA: Diagnosis not present

## 2017-09-25 DIAGNOSIS — M546 Pain in thoracic spine: Secondary | ICD-10-CM | POA: Diagnosis not present

## 2017-09-25 DIAGNOSIS — M9903 Segmental and somatic dysfunction of lumbar region: Secondary | ICD-10-CM | POA: Diagnosis not present

## 2017-09-25 DIAGNOSIS — M47812 Spondylosis without myelopathy or radiculopathy, cervical region: Secondary | ICD-10-CM | POA: Diagnosis not present

## 2017-09-25 DIAGNOSIS — M9901 Segmental and somatic dysfunction of cervical region: Secondary | ICD-10-CM | POA: Diagnosis not present

## 2017-09-25 DIAGNOSIS — M9902 Segmental and somatic dysfunction of thoracic region: Secondary | ICD-10-CM | POA: Diagnosis not present

## 2017-09-25 DIAGNOSIS — M47816 Spondylosis without myelopathy or radiculopathy, lumbar region: Secondary | ICD-10-CM | POA: Diagnosis not present

## 2017-10-01 DIAGNOSIS — M47812 Spondylosis without myelopathy or radiculopathy, cervical region: Secondary | ICD-10-CM | POA: Diagnosis not present

## 2017-10-01 DIAGNOSIS — M9903 Segmental and somatic dysfunction of lumbar region: Secondary | ICD-10-CM | POA: Diagnosis not present

## 2017-10-01 DIAGNOSIS — M47816 Spondylosis without myelopathy or radiculopathy, lumbar region: Secondary | ICD-10-CM | POA: Diagnosis not present

## 2017-10-01 DIAGNOSIS — M9902 Segmental and somatic dysfunction of thoracic region: Secondary | ICD-10-CM | POA: Diagnosis not present

## 2017-10-01 DIAGNOSIS — M546 Pain in thoracic spine: Secondary | ICD-10-CM | POA: Diagnosis not present

## 2017-10-01 DIAGNOSIS — M9901 Segmental and somatic dysfunction of cervical region: Secondary | ICD-10-CM | POA: Diagnosis not present

## 2017-10-02 ENCOUNTER — Emergency Department (HOSPITAL_COMMUNITY)
Admission: EM | Admit: 2017-10-02 | Discharge: 2017-10-02 | Disposition: A | Discharge status: Home / Self Care | Payer: Medicare Other | Attending: Emergency Medicine | Admitting: Emergency Medicine

## 2017-10-02 ENCOUNTER — Other Ambulatory Visit: Payer: Self-pay

## 2017-10-02 ENCOUNTER — Encounter (HOSPITAL_COMMUNITY): Payer: Self-pay | Admitting: Emergency Medicine

## 2017-10-02 DIAGNOSIS — F332 Major depressive disorder, recurrent severe without psychotic features: Secondary | ICD-10-CM | POA: Diagnosis not present

## 2017-10-02 DIAGNOSIS — E7800 Pure hypercholesterolemia, unspecified: Secondary | ICD-10-CM | POA: Diagnosis not present

## 2017-10-02 DIAGNOSIS — Z79899 Other long term (current) drug therapy: Secondary | ICD-10-CM | POA: Insufficient documentation

## 2017-10-02 DIAGNOSIS — R531 Weakness: Secondary | ICD-10-CM | POA: Insufficient documentation

## 2017-10-02 DIAGNOSIS — M545 Low back pain: Secondary | ICD-10-CM | POA: Diagnosis not present

## 2017-10-02 DIAGNOSIS — Z96652 Presence of left artificial knee joint: Secondary | ICD-10-CM | POA: Insufficient documentation

## 2017-10-02 DIAGNOSIS — M1710 Unilateral primary osteoarthritis, unspecified knee: Secondary | ICD-10-CM | POA: Diagnosis not present

## 2017-10-02 DIAGNOSIS — I10 Essential (primary) hypertension: Secondary | ICD-10-CM | POA: Diagnosis not present

## 2017-10-02 DIAGNOSIS — M6281 Muscle weakness (generalized): Secondary | ICD-10-CM | POA: Diagnosis not present

## 2017-10-02 DIAGNOSIS — Z6832 Body mass index (BMI) 32.0-32.9, adult: Secondary | ICD-10-CM | POA: Diagnosis not present

## 2017-10-02 DIAGNOSIS — R404 Transient alteration of awareness: Secondary | ICD-10-CM | POA: Diagnosis not present

## 2017-10-02 DIAGNOSIS — R0789 Other chest pain: Secondary | ICD-10-CM | POA: Diagnosis not present

## 2017-10-02 DIAGNOSIS — Z299 Encounter for prophylactic measures, unspecified: Secondary | ICD-10-CM | POA: Diagnosis not present

## 2017-10-02 DIAGNOSIS — F410 Panic disorder [episodic paroxysmal anxiety] without agoraphobia: Secondary | ICD-10-CM | POA: Diagnosis not present

## 2017-10-02 LAB — BASIC METABOLIC PANEL
Anion gap: 11 (ref 5–15)
BUN: 21 mg/dL — ABNORMAL HIGH (ref 6–20)
CALCIUM: 9.1 mg/dL (ref 8.9–10.3)
CHLORIDE: 94 mmol/L — AB (ref 101–111)
CO2: 26 mmol/L (ref 22–32)
CREATININE: 0.9 mg/dL (ref 0.44–1.00)
Glucose, Bld: 113 mg/dL — ABNORMAL HIGH (ref 65–99)
Potassium: 3.6 mmol/L (ref 3.5–5.1)
SODIUM: 131 mmol/L — AB (ref 135–145)

## 2017-10-02 LAB — CBC WITH DIFFERENTIAL/PLATELET
BASOS ABS: 0 10*3/uL (ref 0.0–0.1)
BASOS PCT: 0 %
EOS ABS: 0.3 10*3/uL (ref 0.0–0.7)
Eosinophils Relative: 3 %
HEMATOCRIT: 35.3 % — AB (ref 36.0–46.0)
HEMOGLOBIN: 11.9 g/dL — AB (ref 12.0–15.0)
Lymphocytes Relative: 12 %
Lymphs Abs: 1.2 10*3/uL (ref 0.7–4.0)
MCH: 30.4 pg (ref 26.0–34.0)
MCHC: 33.7 g/dL (ref 30.0–36.0)
MCV: 90.3 fL (ref 78.0–100.0)
Monocytes Absolute: 1 10*3/uL (ref 0.1–1.0)
Monocytes Relative: 10 %
NEUTROS ABS: 7.8 10*3/uL — AB (ref 1.7–7.7)
NEUTROS PCT: 75 %
Platelets: 224 10*3/uL (ref 150–400)
RBC: 3.91 MIL/uL (ref 3.87–5.11)
RDW: 12.2 % (ref 11.5–15.5)
WBC: 10.3 10*3/uL (ref 4.0–10.5)

## 2017-10-02 LAB — TROPONIN I

## 2017-10-02 MED ORDER — SODIUM CHLORIDE 0.9 % IV BOLUS (SEPSIS)
500.0000 mL | Freq: Once | INTRAVENOUS | Status: AC
  Administered 2017-10-02: 500 mL via INTRAVENOUS

## 2017-10-02 NOTE — ED Triage Notes (Signed)
Pt c/o waking suddenly feeling weakness, warm and flush; pt states she was able to ambulate to the bathroom and had a sm BM

## 2017-10-02 NOTE — ED Provider Notes (Signed)
[name] EMERGENCY DEPARTMENT Provider Note   CSN: [id number] Arrival date & time: 10/02/17  2458     History   Chief Complaint Chief Complaint  Patient presents with  . Weakness    HPI Jane [name] is a 77 y.o. female.  Patient is a 77 year old female with past medical history of arthritis, hypertension, and depression.  She presents today for evaluation of weakness.  She woke from sleep this evening with a feeling of generalized weakness and the urge to have a bowel movement.  She states that she stood up to walk to the bathroom and was so weak she could hardly make it there.  She was able to have a bowel movement, but continued to feel poorly, so called EMS and was transported here.  She reports feeling extremely weak, but denies any chest pain, difficulty breathing, or other significant complaints.   The history is provided by the patient.  Weakness  Primary symptoms comment: Generalized weakness. This is a new problem. The current episode started less than 1 hour ago. The problem has been gradually improving. There was no focality noted. There has been no fever. Pertinent negatives include no shortness of breath, no chest pain, no altered mental status and no headaches. There were no medications administered prior to arrival.    Past Medical History:  Diagnosis Date  . Arthritis   . Depression   . H/O: rheumatic fever    as child  . Heart murmur   . Hx of colonic polyps   . Hypertension     Patient Active Problem List   Diagnosis Date Noted  . Hx of colonic polyps 06/13/2017  . Family history of colon cancer 06/13/2017  . OA (osteoarthritis) of knee 12/16/2016    Past Surgical History:  Procedure Laterality Date  . CARDIAC CATHETERIZATION    . COLONOSCOPY  06/03/2012   Procedure: COLONOSCOPY;  Surgeon: Rogene Houston, MD;  Location: [facility] ENDO SUITE;  Service: Endoscopy;  Laterality: N/A;  1200  . FACIAL COSMETIC SURGERY    . RHINOPLASTY    . TOE SURGERY      . TONSILLECTOMY    . TOTAL KNEE ARTHROPLASTY Left 12/16/2016   Procedure: LEFT TOTAL KNEE ARTHROPLASTY;  Surgeon: Gaynelle Arabian, MD;  Location: [facility] ORS;  Service: Orthopedics;  Laterality: Left;  requests 50mins  . TUBAL LIGATION      OB History    No data available       Home Medications    Prior to Admission medications   Medication Sig Start Date End Date Taking? Authorizing Provider  acetaminophen (TYLENOL) 500 MG tablet Take 1,000 mg by mouth daily as needed for moderate pain.     [provider]  citalopram (CELEXA) 20 MG tablet Take 20 mg by mouth at bedtime.    [provider]  diclofenac sodium (VOLTAREN) 1 % GEL Apply to affected area 3-4 times daily PRN 07/28/17   Bo Merino, MD  lisinopril (PRINIVIL,ZESTRIL) 20 MG tablet Take 20 mg by mouth 2 (two) times daily.    [provider]  magnesium oxide (MAG-OX) 400 MG tablet Take 400 mg by mouth daily.    [provider]  meclizine (ANTIVERT) 25 MG tablet Take 12.5-25 mg by mouth daily as needed for dizziness. .-1/2-1 tablet as needed for vertigo     [provider]  simvastatin (ZOCOR) 20 MG tablet Take 20 mg by mouth at bedtime.    [provider]    [facility]  History No family history on file.  Social History Social History   Tobacco Use  . Smoking status: Never Smoker  . Smokeless tobacco: Never Used  Substance Use Topics  . Alcohol use: Yes    Comment: rare  . Drug use: No     Allergies   Ciprofloxacin hcl; Clindamycin/lincomycin; Flagyl [metronidazole]; Lodine [etodolac]; Other; and Penicillins   Review of Systems Review of Systems  Respiratory: Negative for shortness of breath.   Cardiovascular: Negative for chest pain.  Neurological: Positive for weakness. Negative for headaches.  All other systems reviewed and are negative.    Physical Exam Updated Vital Signs BP (!) 183/87 (BP Location: Left Arm)   Pulse 68   Temp 97.8 F (36.6 C)  (Oral)   Resp 18   Ht 5\' 2"  (1.575 m)   Wt 79.4 kg (175 lb)   SpO2 96%   BMI 32.01 kg/m   Physical Exam  Constitutional: She is oriented to person, place, and time. She appears well-developed and well-nourished. No distress.  HENT:  Head: Normocephalic and atraumatic.  Mouth/Throat: Oropharynx is clear and moist.  Neck: Normal range of motion. Neck supple.  Cardiovascular: Normal rate and regular rhythm. Exam reveals no gallop and no friction rub.  No murmur heard. Pulmonary/Chest: Effort normal and breath sounds normal. No respiratory distress. She has no wheezes.  Abdominal: Soft. Bowel sounds are normal. She exhibits no distension. There is no tenderness.  Musculoskeletal: Normal range of motion.  Neurological: She is alert and oriented to person, place, and time. No cranial nerve deficit. She exhibits normal muscle tone. Coordination normal.  Skin: Skin is warm and dry. She is not diaphoretic.  Nursing note and vitals reviewed.    ED Treatments / Results  Labs (all labs ordered are listed, but only abnormal results are displayed) Labs Reviewed  BASIC METABOLIC PANEL  CBC WITH DIFFERENTIAL/PLATELET  TROPONIN I    EKG  EKG Interpretation None       Radiology No results found.  Procedures Procedures (including critical care time)  Medications Ordered in ED Medications  sodium chloride 0.9 % bolus 500 mL (not administered)     Initial Impression / Assessment and Plan / ED Course  I have reviewed the triage vital signs and the nursing notes.  Pertinent labs & imaging results that were available during my care of the patient were reviewed by me and considered in my medical decision making (see chart for details).  Patient presents here after an episode of weakness that occurred in the night while she was sleeping.  She woke from sleep with a feeling of impending doom that I suspect is related to a panic attack.  She appears very emotional here in the ER and  very anxious.  Her workup reveals no significant abnormality.  Her EKG is unchanged, there is no anemia, and electrolytes are essentially unremarkable.  At this point, I see no indication for further workup.  I am convinced that this is related to anxiety given her complaints and state of mind here in the ER.  She will be discharged to home.  She has a follow-up appointment today with her primary doctor and tells me she plans to keep this.  Final Clinical Impressions(s) / ED Diagnoses   Final diagnoses:  None    ED Discharge Orders    None       Veryl Speak, MD 10/02/17 [phone]

## 2017-10-02 NOTE — Discharge Instructions (Signed)
Follow-up with your primary doctor today as scheduled, and return to the ER if you develop new or worsening symptoms.

## 2017-10-03 DIAGNOSIS — R079 Chest pain, unspecified: Secondary | ICD-10-CM | POA: Diagnosis not present

## 2017-10-03 DIAGNOSIS — R0781 Pleurodynia: Secondary | ICD-10-CM | POA: Diagnosis not present

## 2017-10-03 DIAGNOSIS — M47816 Spondylosis without myelopathy or radiculopathy, lumbar region: Secondary | ICD-10-CM | POA: Diagnosis not present

## 2017-10-03 DIAGNOSIS — J449 Chronic obstructive pulmonary disease, unspecified: Secondary | ICD-10-CM | POA: Diagnosis not present

## 2017-10-03 DIAGNOSIS — R0789 Other chest pain: Secondary | ICD-10-CM | POA: Diagnosis not present

## 2017-10-13 DIAGNOSIS — M47812 Spondylosis without myelopathy or radiculopathy, cervical region: Secondary | ICD-10-CM | POA: Diagnosis not present

## 2017-10-13 DIAGNOSIS — M47816 Spondylosis without myelopathy or radiculopathy, lumbar region: Secondary | ICD-10-CM | POA: Diagnosis not present

## 2017-10-13 DIAGNOSIS — M546 Pain in thoracic spine: Secondary | ICD-10-CM | POA: Diagnosis not present

## 2017-10-13 DIAGNOSIS — M9903 Segmental and somatic dysfunction of lumbar region: Secondary | ICD-10-CM | POA: Diagnosis not present

## 2017-10-13 DIAGNOSIS — M9901 Segmental and somatic dysfunction of cervical region: Secondary | ICD-10-CM | POA: Diagnosis not present

## 2017-10-13 DIAGNOSIS — M9902 Segmental and somatic dysfunction of thoracic region: Secondary | ICD-10-CM | POA: Diagnosis not present

## 2017-10-14 ENCOUNTER — Ambulatory Visit: Payer: Medicare Other | Admitting: Nutrition

## 2017-10-27 DIAGNOSIS — M546 Pain in thoracic spine: Secondary | ICD-10-CM | POA: Diagnosis not present

## 2017-10-27 DIAGNOSIS — M9901 Segmental and somatic dysfunction of cervical region: Secondary | ICD-10-CM | POA: Diagnosis not present

## 2017-10-27 DIAGNOSIS — M9903 Segmental and somatic dysfunction of lumbar region: Secondary | ICD-10-CM | POA: Diagnosis not present

## 2017-10-27 DIAGNOSIS — M9902 Segmental and somatic dysfunction of thoracic region: Secondary | ICD-10-CM | POA: Diagnosis not present

## 2017-10-27 DIAGNOSIS — M47816 Spondylosis without myelopathy or radiculopathy, lumbar region: Secondary | ICD-10-CM | POA: Diagnosis not present

## 2017-10-27 DIAGNOSIS — M47812 Spondylosis without myelopathy or radiculopathy, cervical region: Secondary | ICD-10-CM | POA: Diagnosis not present

## 2017-10-30 ENCOUNTER — Encounter (INDEPENDENT_AMBULATORY_CARE_PROVIDER_SITE_OTHER): Payer: Self-pay | Admitting: *Deleted

## 2017-10-30 ENCOUNTER — Telehealth (INDEPENDENT_AMBULATORY_CARE_PROVIDER_SITE_OTHER): Payer: Self-pay | Admitting: *Deleted

## 2017-10-30 NOTE — Telephone Encounter (Signed)
Referring MD/PCP: vyas   Procedure: tcs  Reason/Indication:  Hx polyps, fam hx colon ca  Has patient had this procedure before?  Yes, 2013  If so, when, by whom and where?    Is there a family history of colon cancer?  Yes, mother  Who?  What age when diagnosed?    Is patient diabetic?   no      Does patient have prosthetic heart valve or mechanical valve?  no  Do you have a pacemaker?  no  Has patient ever had endocarditis? no  Has patient had joint replacement within last 12 months?  Yes, 6 mths ago  Is patient constipated or do they take laxatives? no  Does patient have a history of alcohol/drug use?  no  Is patient on blood thinner such as Coumadin, Plavix and/or Aspirin? yes  Medications: asa 81 mg daily, lisinopril 20 mg bid, simvastatin 20 mg daily, citalopram 20 mg daily, tylenol arthritis daily, meclizine prn  Allergies: see epic  Medication Adjustment per Dr Lindi Adie, NP: asa 2 days  Procedure date & time: 11/26/17 at 1130

## 2017-10-30 NOTE — Telephone Encounter (Signed)
Patient needs trilyte 

## 2017-11-03 MED ORDER — PEG 3350-KCL-NA BICARB-NACL 420 G PO SOLR: 4000.0000 mL | Freq: Once | ORAL | 0 refills | Status: AC

## 2017-11-03 NOTE — Telephone Encounter (Signed)
agree

## 2017-11-06 DIAGNOSIS — Z471 Aftercare following joint replacement surgery: Secondary | ICD-10-CM | POA: Diagnosis not present

## 2017-11-06 DIAGNOSIS — M1712 Unilateral primary osteoarthritis, left knee: Secondary | ICD-10-CM | POA: Diagnosis not present

## 2017-11-06 DIAGNOSIS — Z96652 Presence of left artificial knee joint: Secondary | ICD-10-CM | POA: Diagnosis not present

## 2017-11-18 ENCOUNTER — Other Ambulatory Visit (HOSPITAL_COMMUNITY): Payer: Self-pay | Admitting: Orthopedic Surgery

## 2017-11-18 DIAGNOSIS — Z96652 Presence of left artificial knee joint: Secondary | ICD-10-CM

## 2017-11-26 ENCOUNTER — Other Ambulatory Visit: Payer: Self-pay

## 2017-11-26 ENCOUNTER — Encounter (HOSPITAL_COMMUNITY)
Admission: RE | Disposition: A | Discharge status: Home / Self Care | Payer: Self-pay | Source: Ambulatory Visit | Attending: Internal Medicine

## 2017-11-26 ENCOUNTER — Encounter (HOSPITAL_COMMUNITY): Payer: Self-pay

## 2017-11-26 ENCOUNTER — Ambulatory Visit (HOSPITAL_COMMUNITY)
Admission: RE | Admit: 2017-11-26 | Discharge: 2017-11-26 | Disposition: A | Discharge status: Home / Self Care | Payer: Medicare Other | Source: Ambulatory Visit | Attending: Internal Medicine | Admitting: Internal Medicine

## 2017-11-26 DIAGNOSIS — K648 Other hemorrhoids: Secondary | ICD-10-CM | POA: Diagnosis not present

## 2017-11-26 DIAGNOSIS — Z7982 Long term (current) use of aspirin: Secondary | ICD-10-CM | POA: Insufficient documentation

## 2017-11-26 DIAGNOSIS — R011 Cardiac murmur, unspecified: Secondary | ICD-10-CM | POA: Insufficient documentation

## 2017-11-26 DIAGNOSIS — K644 Residual hemorrhoidal skin tags: Secondary | ICD-10-CM | POA: Diagnosis not present

## 2017-11-26 DIAGNOSIS — Z86010 Personal history of colonic polyps: Secondary | ICD-10-CM | POA: Insufficient documentation

## 2017-11-26 DIAGNOSIS — M1990 Unspecified osteoarthritis, unspecified site: Secondary | ICD-10-CM | POA: Insufficient documentation

## 2017-11-26 DIAGNOSIS — Z881 Allergy status to other antibiotic agents status: Secondary | ICD-10-CM | POA: Diagnosis not present

## 2017-11-26 DIAGNOSIS — Z1211 Encounter for screening for malignant neoplasm of colon: Secondary | ICD-10-CM | POA: Insufficient documentation

## 2017-11-26 DIAGNOSIS — Z880 Allergy status to penicillin: Secondary | ICD-10-CM | POA: Insufficient documentation

## 2017-11-26 DIAGNOSIS — I10 Essential (primary) hypertension: Secondary | ICD-10-CM | POA: Insufficient documentation

## 2017-11-26 DIAGNOSIS — Z79899 Other long term (current) drug therapy: Secondary | ICD-10-CM | POA: Diagnosis not present

## 2017-11-26 DIAGNOSIS — F329 Major depressive disorder, single episode, unspecified: Secondary | ICD-10-CM | POA: Diagnosis not present

## 2017-11-26 DIAGNOSIS — Z800 Family history of malignant neoplasm of digestive organs: Secondary | ICD-10-CM | POA: Insufficient documentation

## 2017-11-26 DIAGNOSIS — Z888 Allergy status to other drugs, medicaments and biological substances status: Secondary | ICD-10-CM | POA: Diagnosis not present

## 2017-11-26 DIAGNOSIS — Z96652 Presence of left artificial knee joint: Secondary | ICD-10-CM | POA: Diagnosis not present

## 2017-11-26 DIAGNOSIS — K5730 Diverticulosis of large intestine without perforation or abscess without bleeding: Secondary | ICD-10-CM | POA: Insufficient documentation

## 2017-11-26 HISTORY — PX: COLONOSCOPY: SHX5424

## 2017-11-26 LAB — HEMOGLOBIN AND HEMATOCRIT, BLOOD
HCT: 38.5 % (ref 36.0–46.0)
Hemoglobin: 12.6 g/dL (ref 12.0–15.0)

## 2017-11-26 SURGERY — COLONOSCOPY
Anesthesia: Moderate Sedation

## 2017-11-26 MED ORDER — MIDAZOLAM HCL 5 MG/5ML IJ SOLN
INTRAMUSCULAR | Status: DC | PRN
  Administered 2017-11-26: 2 mg via INTRAVENOUS
  Administered 2017-11-26: 1 mg via INTRAVENOUS
  Administered 2017-11-26: 2 mg via INTRAVENOUS

## 2017-11-26 MED ORDER — MIDAZOLAM HCL 5 MG/5ML IJ SOLN
INTRAMUSCULAR | Status: AC
  Filled 2017-11-26: qty 10

## 2017-11-26 MED ORDER — STERILE WATER FOR IRRIGATION IR SOLN
Status: DC | PRN
  Administered 2017-11-26: 100 mL

## 2017-11-26 MED ORDER — SODIUM CHLORIDE 0.9 % IV SOLN
INTRAVENOUS | Status: DC
  Administered 2017-11-26: 11:00:00 via INTRAVENOUS

## 2017-11-26 MED ORDER — MEPERIDINE HCL 50 MG/ML IJ SOLN
INTRAMUSCULAR | Status: AC
  Filled 2017-11-26: qty 1

## 2017-11-26 MED ORDER — MEPERIDINE HCL 50 MG/ML IJ SOLN
INTRAMUSCULAR | Status: DC | PRN
  Administered 2017-11-26 (×2): 25 mg via INTRAVENOUS

## 2017-11-26 NOTE — Discharge Instructions (Signed)
Moderate Conscious Sedation, Adult, Care After These instructions provide you with information about caring for yourself after your procedure. Your health care provider may also give you more specific instructions. Your treatment has been planned according to current medical practices, but problems sometimes occur. Call your health care provider if you have any problems or questions after your procedure. What can I expect after the procedure? After your procedure, it is common:  To feel sleepy for several hours.  To feel clumsy and have poor balance for several hours.  To have poor judgment for several hours.  To vomit if you eat too soon.  Follow these instructions at home: For at least 24 hours after the procedure:   Do not: ? Participate in activities where you could fall or become injured. ? Drive. ? Use heavy machinery. ? Drink alcohol. ? Take sleeping pills or medicines that cause drowsiness. ? Make important decisions or sign legal documents. ? Take care of children on your own.  Rest. Eating and drinking  Follow the diet recommended by your health care provider.  If you vomit: ? Drink water, juice, or soup when you can drink without vomiting. ? Make sure you have little or no nausea before eating solid foods. General instructions  Have a responsible adult stay with you until you are awake and alert.  Take over-the-counter and prescription medicines only as told by your health care provider.  If you smoke, do not smoke without supervision.  Keep all follow-up visits as told by your health care provider. This is important. Contact a health care provider if:  You keep feeling nauseous or you keep vomiting.  You feel light-headed.  You develop a rash.  You have a fever. Get help right away if:  You have trouble breathing. This information is not intended to replace advice given to you by your health care provider. Make sure you discuss any questions you  have with your health care provider. Document Released: 06/16/2013 Document Revised: 01/29/2016 Document Reviewed: 12/16/2015 Elsevier Interactive Patient Education  2018 [employer].   Colonoscopy, Adult, Care After This sheet gives you information about how to care for yourself after your procedure. Your doctor may also give you more specific instructions. If you have problems or questions, call your doctor. Follow these instructions at home: General instructions   For the first 24 hours after the procedure: ? Do not drive or use machinery. ? Do not sign important documents. ? Do not drink alcohol. ? Do your daily activities more slowly than normal. ? Eat foods that are soft and easy to digest. ? Rest often.  Take over-the-counter or prescription medicines only as told by your doctor.  It is up to you to get the results of your procedure. Ask your doctor, or the department performing the procedure, when your results will be ready. To help cramping and bloating:  Try walking around.  Put heat on your belly (abdomen) as told by your doctor. Use a heat source that your doctor recommends, such as a moist heat pack or a heating pad. ? Put a towel between your skin and the heat source. ? Leave the heat on for 20-30 minutes. ? Remove the heat if your skin turns bright red. This is especially important if you cannot feel pain, heat, or cold. You can get burned. Eating and drinking  Drink enough fluid to keep your pee (urine) clear or pale yellow.  Return to your normal diet as told by your doctor.  Avoid heavy or fried foods that are hard to digest.  Avoid drinking alcohol for as long as told by your doctor. Contact a doctor if:  You have blood in your poop (stool) 2-3 days after the procedure. Get help right away if:  You have more than a small amount of blood in your poop.  You see large clumps of tissue (blood clots) in your poop.  Your belly is swollen.  You feel sick  to your stomach (nauseous).  You throw up (vomit).  You have a fever.  You have belly pain that gets worse, and medicine does not help your pain. This information is not intended to replace advice given to you by your health care provider. Make sure you discuss any questions you have with your health care provider. Document Released: 09/28/2010 Document Revised: 05/20/2016 Document Reviewed: 05/20/2016 Elsevier Interactive Patient Education  2017 [employer]   Hemorrhoids Hemorrhoids are swollen veins in and around the rectum or anus. There are two types of hemorrhoids:  Internal hemorrhoids. These occur in the veins that are just inside the rectum. They may poke through to the outside and become irritated and painful.  External hemorrhoids. These occur in the veins that are outside of the anus and can be felt as a painful swelling or hard lump near the anus.  Most hemorrhoids do not cause serious problems, and they can be managed with home treatments such as diet and lifestyle changes. If home treatments do not help your symptoms, procedures can be done to shrink or remove the hemorrhoids. What are the causes? This condition is caused by increased pressure in the anal area. This pressure may result from various things, including:  Constipation.  Straining to have a bowel movement.  Diarrhea.  Pregnancy.  Obesity.  Sitting for long periods of time.  Heavy lifting or other activity that causes you to strain.  Anal sex.  What are the signs or symptoms? Symptoms of this condition include:  Pain.  Anal itching or irritation.  Rectal bleeding.  Leakage of stool (feces).  Anal swelling.  One or more lumps around the anus.  How is this diagnosed? This condition can often be diagnosed through a visual exam. Other exams or tests may also be done, such as:  Examination of the rectal area with a gloved hand (digital rectal exam).  Examination of the anal canal using  a small tube (anoscope).  A blood test, if you have lost a significant amount of blood.  A test to look inside the colon (sigmoidoscopy or colonoscopy).  How is this treated? This condition can usually be treated at home. However, various procedures may be done if dietary changes, lifestyle changes, and other home treatments do not help your symptoms. These procedures can help make the hemorrhoids smaller or remove them completely. Some of these procedures involve surgery, and others do not. Common procedures include:  Rubber band ligation. Rubber bands are placed at the base of the hemorrhoids to cut off the blood supply to them.  Sclerotherapy. Medicine is injected into the hemorrhoids to shrink them.  Infrared coagulation. A type of light energy is used to get rid of the hemorrhoids.  Hemorrhoidectomy surgery. The hemorrhoids are surgically removed, and the veins that supply them are tied off.  Stapled hemorrhoidopexy surgery. A circular stapling device is used to remove the hemorrhoids and use staples to cut off the blood supply to them.  Follow these instructions at home: Eating and drinking  Eat foods  that have a lot of fiber in them, such as whole grains, beans, nuts, fruits, and vegetables. Ask your health care provider about taking products that have added fiber (fiber supplements).  Drink enough fluid to keep your urine clear or pale yellow. Managing pain and swelling  Take warm sitz baths for 20 minutes, 3-4 times a day to ease pain and discomfort.  If directed, apply ice to the affected area. Using ice packs between sitz baths may be helpful. ? Put ice in a plastic bag. ? Place a towel between your skin and the bag. ? Leave the ice on for 20 minutes, 2-3 times a day. General instructions  Take over-the-counter and prescription medicines only as told by your health care provider.  Use medicated creams or suppositories as told.  Exercise regularly.  Go to the  bathroom when you have the urge to have a bowel movement. Do not wait.  Avoid straining to have bowel movements.  Keep the anal area dry and clean. Use wet toilet paper or moist towelettes after a bowel movement.  Do not sit on the toilet for long periods of time. This increases blood pooling and pain. Contact a health care provider if:  You have increasing pain and swelling that are not controlled by treatment or medicine.  You have uncontrolled bleeding.  You have difficulty having a bowel movement, or you are unable to have a bowel movement.  You have pain or inflammation outside the area of the hemorrhoids. This information is not intended to replace advice given to you by your health care provider. Make sure you discuss any questions you have with your health care provider. Document Released: 08/23/2000 Document Revised: 01/24/2016 Document Reviewed: 05/10/2015 Elsevier Interactive Patient Education  2018 [place] usual medications including aspirin as before. High fiber diet. No driving for 24 hours. May consider another exam in 5 years.

## 2017-11-26 NOTE — Op Note (Addendum)
[facility] Patient Name: Jane [name] Procedure Date: 11/26/2017 10:35 AM MRN: [id number] Date of Birth: [birth year] Attending MD: Lionel December , MD CSN: [id number] Age: 77 Admit Type: Outpatient Procedure:                Colonoscopy Indications:              Screening in patient at increased risk: Colorectal                            cancer in mother 19 or older Providers:                Lionel December, MD, Edrick Kins, RN, Dyann Ruddle Referring MD:             Ignatius Specking, MD Medicines:                Meperidine 50 mg IV, Midazolam 5 mg IV Complications:            No immediate complications. Estimated Blood Loss:     Estimated blood loss: none. Procedure:                Pre-Anesthesia Assessment:                           - Prior to the procedure, a History and Physical                            was performed, and patient medications and                            allergies were reviewed. The patient's tolerance of                            previous anesthesia was also reviewed. The risks                            and benefits of the procedure and the sedation                            options and risks were discussed with the patient.                            All questions were answered, and informed consent                            was obtained. Prior Anticoagulants: The patient                            last took aspirin 4 days prior to the procedure.                            ASA Grade Assessment: II - A patient with mild                            systemic disease. After reviewing the risks and  benefits, the patient was deemed in satisfactory                            condition to undergo the procedure.                           After obtaining informed consent, the colonoscope                            was passed under direct vision. Throughout the                            procedure, the patient's blood pressure, pulse, and                            oxygen saturations were monitored continuously. The                            [id number] ([id number]) scope was introduced through                            the anus and advanced to the the cecum, identified                            by appendiceal orifice and ileocecal valve. The                            colonoscopy was performed without difficulty. The                            patient tolerated the procedure well. The quality                            of the bowel preparation was excellent. The                            ileocecal valve, appendiceal orifice, and rectum                            were photographed. Scope In: 11:28:55 AM Scope Out: 11:49:17 AM Scope Withdrawal Time: 0 hours 12 minutes 8 seconds  Total Procedure Duration: 0 hours 20 minutes 22 seconds  Findings:      The perianal and digital rectal examinations were normal.      A few medium-mouthed diverticula were found in the sigmoid colon.      The exam was otherwise normal throughout the examined colon.      External and internal hemorrhoids were found during retroflexion. The       hemorrhoids were small. Impression:               - Diverticulosis in the sigmoid colon.                           - External and internal hemorrhoids.                           -  No specimens collected. Moderate Sedation:      Moderate (conscious) sedation was administered by the endoscopy nurse       and supervised by the endoscopist. The following parameters were       monitored: oxygen saturation, heart rate, blood pressure, CO2       capnography and response to care. Total physician intraservice time was       26 minutes. Recommendation:           - Patient has a contact number available for                            emergencies. The signs and symptoms of potential                            delayed complications were discussed with the                            patient. Return to normal activities  tomorrow.                            Written discharge instructions were provided to the                            patient.                           - High fiber diet today.                           - Continue present medications.                           - May consider next colonoscopy in 5 years for                            screening purposes. Procedure Code(s):        --- Professional ---                           [phone], Colonoscopy, flexible; diagnostic, including                            collection of specimen(s) by brushing or washing,                            when performed (separate procedure)                           99152, Moderate sedation services provided by the                            same physician or other qualified health care                            professional performing the diagnostic or  therapeutic service that the sedation supports,                            requiring the presence of an independent trained                            observer to assist in the monitoring of the                            patient's level of consciousness and physiological                            status; initial 15 minutes of intraservice time,                            patient age 19 years or older                           [phone], Moderate sedation services; each additional                            15 minutes intraservice time Diagnosis Code(s):        --- Professional ---                           Z80.0, Family history of malignant neoplasm of                            digestive organs                           K64.8, Other hemorrhoids                           K57.30, Diverticulosis of large intestine without                            perforation or abscess without bleeding CPT copyright 2016 [employer]. All rights reserved. The codes documented in this report are preliminary and upon coder review may  be revised  to meet current compliance requirements. Lionel December, MD Lionel December, MD 11/26/2017 11:58:04 AM This report has been signed electronically. Number of Addenda: 0

## 2017-11-26 NOTE — H&P (Signed)
Jane [name] is an 77 y.o. female.   Chief Complaint: Patient is here for colonoscopy. HPI: patient is 85 old Caucasian female who is here for high risk screening colonoscopy. She denies abdominal pain change in bowel habits or rectal bleeding. She says she got sick last and when she was taken prep. For some vomitus was clear but second one was blood-tinged. She did not have frank bleeding. She did not experience melena or rectal bleeding when she took her Pap. She does not have history of peptic ulcer disease. Last aspirin dose was 4 days ago. Last colonoscopy is a 2013. She had 2 polyps removed and these are hyperplastic polyps.  Family history is positive for CRC in mother who was in her 69s at the time of diagnosis.  Past Medical History:  Diagnosis Date  . Arthritis   . Depression   . H/O: rheumatic fever    as child  . Heart murmur   . Hx of colonic polyps   . Hypertension     Past Surgical History:  Procedure Laterality Date  . CARDIAC CATHETERIZATION    . COLONOSCOPY  06/03/2012   Procedure: COLONOSCOPY;  Surgeon: Rogene Houston, MD;  Location: [facility];  Service: Endoscopy;  Laterality: N/A;  1200  . FACIAL COSMETIC SURGERY    . RHINOPLASTY    . TOE SURGERY    . TONSILLECTOMY    . TOTAL KNEE ARTHROPLASTY Left 12/16/2016   Procedure: LEFT TOTAL KNEE ARTHROPLASTY;  Surgeon: Gaynelle Arabian, MD;  Location: [facility];  Service: Orthopedics;  Laterality: Left;  requests 78mins  . TUBAL LIGATION      History reviewed. No pertinent family history. Social History:  reports that  has never smoked. she has never used smokeless tobacco. She reports that she drinks alcohol. She reports that she does not use drugs.  Allergies:  Allergies  Allergen Reactions  . Ciprofloxacin Hcl Other (See Comments)    Hand stiffness   . Clindamycin/Lincomycin Other (See Comments)    Per patient, give me c-diff  . Flagyl [Metronidazole] Hives and Other (See Comments)    Questionable if this  medication was the cause.  . Lodine [Etodolac] Other (See Comments)    Stomach pain   . Other Other (See Comments)    Has a very low tolerance to sedation and stimulating drugs   . Penicillins Diarrhea, Rash and Other (See Comments)    Has patient had a PCN reaction causing immediate rash, facial/tongue/throat swelling, SOB or lightheadedness with hypotension: unknown Has patient had a PCN reaction causing severe rash involving mucus membranes or skin necrosis: unknown Has patient had a PCN reaction that required hospitalization unknown Has patient had a PCN reaction occurring within the last 10 years: No If all of the above answers are "NO", then may proceed with Cephalosporin use.     Medications Prior to Admission  Medication Sig Dispense Refill  . acetaminophen (TYLENOL) 500 MG tablet Take 1,000 mg by mouth every 6 (six) hours as needed for moderate pain or headache.     Marland Kitchen aspirin EC 81 MG tablet Take 81 mg by mouth at bedtime.    . Calcium Carbonate-Vitamin D (CALTRATE 600+D PO) Take 1 tablet by mouth 2 (two) times daily.    . Cholecalciferol (VITAMIN D3) 2000 units TABS Take 2,000 Units by mouth daily.    . citalopram (CELEXA) 40 MG tablet Take 40 mg by mouth at bedtime.     . diclofenac sodium (VOLTAREN) 1 %  GEL Apply to affected area 3-4 times daily PRN (Patient taking differently: Apply 1 application topically 4 (four) times daily as needed (for pain). ) 3 Tube 3  . lisinopril (PRINIVIL,ZESTRIL) 20 MG tablet Take 20 mg by mouth 2 (two) times daily.    . magnesium oxide (MAG-OX) 400 MG tablet Take 400 mg by mouth daily.    . meclizine (ANTIVERT) 25 MG tablet Take 12.5-25 mg by mouth daily as needed for dizziness.     . Multiple Vitamins-Minerals (CENTRUM SILVER PO) Take 1 tablet by mouth daily.    . Omega-3 Fatty Acids (FISH OIL PO) Take 2 capsules by mouth daily.    . polyethylene glycol-electrolytes (NULYTELY/GOLYTELY) 420 g solution Take 4,000 mLs by mouth once.  0  .  simvastatin (ZOCOR) 20 MG tablet Take 20 mg by mouth at bedtime.    . Tetrahydrozoline HCl (MURINE FOR RED EYES OP) Place 1 drop into both eyes daily as needed (for red/dry eyes).      No results found for this or any previous visit (from the past 48 hour(s)). No results found.  ROS  Blood pressure (!) 167/74, pulse 94, temperature 98.7 F (37.1 C), temperature source Oral, resp. rate 16, height 5\' 2"  (1.575 m), weight 172 lb (78 kg), SpO2 98 %. Physical Exam  Constitutional: She appears well-developed and well-nourished.  HENT:  Mouth/Throat: Oropharynx is clear and moist.  Eyes: Conjunctivae are normal. No scleral icterus.  Neck: No thyromegaly present.  Cardiovascular: Normal rate, regular rhythm and normal heart sounds.  No murmur heard. Respiratory: Effort normal and breath sounds normal.  GI: Soft. She exhibits no distension and no mass. There is no tenderness.  Musculoskeletal: She exhibits no edema.  Neurological: She is alert.  Skin: Skin is warm and dry.     Assessment/Plan Family history of CRC in first-degree relative. History of non-adenomatous polyps. High risk screening colonoscopy.  Hildred Laser, MD 11/26/2017, 11:18 AM

## 2017-11-27 ENCOUNTER — Encounter (HOSPITAL_COMMUNITY): Payer: Medicare Other

## 2017-11-27 ENCOUNTER — Other Ambulatory Visit (HOSPITAL_COMMUNITY): Payer: Medicare Other

## 2017-12-01 ENCOUNTER — Encounter (HOSPITAL_COMMUNITY): Payer: Self-pay | Admitting: Internal Medicine

## 2017-12-01 DIAGNOSIS — Z713 Dietary counseling and surveillance: Secondary | ICD-10-CM | POA: Diagnosis not present

## 2017-12-01 DIAGNOSIS — I10 Essential (primary) hypertension: Secondary | ICD-10-CM | POA: Diagnosis not present

## 2017-12-01 DIAGNOSIS — Z299 Encounter for prophylactic measures, unspecified: Secondary | ICD-10-CM | POA: Diagnosis not present

## 2017-12-01 DIAGNOSIS — Z6832 Body mass index (BMI) 32.0-32.9, adult: Secondary | ICD-10-CM | POA: Diagnosis not present

## 2017-12-01 DIAGNOSIS — F332 Major depressive disorder, recurrent severe without psychotic features: Secondary | ICD-10-CM | POA: Diagnosis not present

## 2017-12-03 ENCOUNTER — Encounter (HOSPITAL_COMMUNITY)
Admission: RE | Admit: 2017-12-03 | Discharge: 2017-12-03 | Disposition: A | Discharge status: Home / Self Care | Payer: Medicare Other | Source: Ambulatory Visit | Attending: Orthopedic Surgery | Admitting: Orthopedic Surgery

## 2017-12-03 ENCOUNTER — Encounter (HOSPITAL_COMMUNITY): Payer: Medicare Other

## 2017-12-03 DIAGNOSIS — K648 Other hemorrhoids: Secondary | ICD-10-CM

## 2017-12-03 DIAGNOSIS — K5730 Diverticulosis of large intestine without perforation or abscess without bleeding: Secondary | ICD-10-CM

## 2017-12-03 DIAGNOSIS — Z471 Aftercare following joint replacement surgery: Secondary | ICD-10-CM | POA: Diagnosis not present

## 2017-12-03 DIAGNOSIS — Z96652 Presence of left artificial knee joint: Secondary | ICD-10-CM | POA: Diagnosis not present

## 2017-12-03 DIAGNOSIS — Z800 Family history of malignant neoplasm of digestive organs: Secondary | ICD-10-CM

## 2017-12-03 DIAGNOSIS — Z1211 Encounter for screening for malignant neoplasm of colon: Secondary | ICD-10-CM

## 2017-12-03 MED ORDER — TECHNETIUM TC 99M MEDRONATE IV KIT
25.0000 | PACK | Freq: Once | INTRAVENOUS | Status: AC | PRN
  Administered 2017-12-03: 25 via INTRAVENOUS

## 2017-12-12 DIAGNOSIS — M1712 Unilateral primary osteoarthritis, left knee: Secondary | ICD-10-CM | POA: Diagnosis not present

## 2017-12-18 DIAGNOSIS — M545 Low back pain: Secondary | ICD-10-CM | POA: Diagnosis not present

## 2017-12-19 DIAGNOSIS — M5416 Radiculopathy, lumbar region: Secondary | ICD-10-CM | POA: Diagnosis not present

## 2017-12-24 DIAGNOSIS — M9901 Segmental and somatic dysfunction of cervical region: Secondary | ICD-10-CM | POA: Diagnosis not present

## 2017-12-24 DIAGNOSIS — M546 Pain in thoracic spine: Secondary | ICD-10-CM | POA: Diagnosis not present

## 2017-12-24 DIAGNOSIS — M9903 Segmental and somatic dysfunction of lumbar region: Secondary | ICD-10-CM | POA: Diagnosis not present

## 2017-12-24 DIAGNOSIS — M47812 Spondylosis without myelopathy or radiculopathy, cervical region: Secondary | ICD-10-CM | POA: Diagnosis not present

## 2017-12-24 DIAGNOSIS — M9902 Segmental and somatic dysfunction of thoracic region: Secondary | ICD-10-CM | POA: Diagnosis not present

## 2017-12-24 DIAGNOSIS — M47816 Spondylosis without myelopathy or radiculopathy, lumbar region: Secondary | ICD-10-CM | POA: Diagnosis not present

## 2017-12-30 DIAGNOSIS — M25662 Stiffness of left knee, not elsewhere classified: Secondary | ICD-10-CM | POA: Diagnosis not present

## 2017-12-30 DIAGNOSIS — Z96652 Presence of left artificial knee joint: Secondary | ICD-10-CM | POA: Diagnosis not present

## 2018-01-02 DIAGNOSIS — M25662 Stiffness of left knee, not elsewhere classified: Secondary | ICD-10-CM | POA: Diagnosis not present

## 2018-01-02 DIAGNOSIS — Z96652 Presence of left artificial knee joint: Secondary | ICD-10-CM | POA: Diagnosis not present

## 2018-01-06 DIAGNOSIS — Z96652 Presence of left artificial knee joint: Secondary | ICD-10-CM | POA: Diagnosis not present

## 2018-01-06 DIAGNOSIS — M25662 Stiffness of left knee, not elsewhere classified: Secondary | ICD-10-CM | POA: Diagnosis not present

## 2018-01-08 DIAGNOSIS — M25662 Stiffness of left knee, not elsewhere classified: Secondary | ICD-10-CM | POA: Diagnosis not present

## 2018-01-08 DIAGNOSIS — Z96652 Presence of left artificial knee joint: Secondary | ICD-10-CM | POA: Diagnosis not present

## 2018-01-16 DIAGNOSIS — Z96652 Presence of left artificial knee joint: Secondary | ICD-10-CM | POA: Diagnosis not present

## 2018-01-16 DIAGNOSIS — M25662 Stiffness of left knee, not elsewhere classified: Secondary | ICD-10-CM | POA: Diagnosis not present

## 2018-01-19 DIAGNOSIS — M25662 Stiffness of left knee, not elsewhere classified: Secondary | ICD-10-CM | POA: Diagnosis not present

## 2018-01-19 DIAGNOSIS — Z96652 Presence of left artificial knee joint: Secondary | ICD-10-CM | POA: Diagnosis not present

## 2018-01-22 DIAGNOSIS — M25662 Stiffness of left knee, not elsewhere classified: Secondary | ICD-10-CM | POA: Diagnosis not present

## 2018-01-22 DIAGNOSIS — Z96652 Presence of left artificial knee joint: Secondary | ICD-10-CM | POA: Diagnosis not present

## 2018-01-23 DIAGNOSIS — M1712 Unilateral primary osteoarthritis, left knee: Secondary | ICD-10-CM | POA: Diagnosis not present

## 2018-01-27 DIAGNOSIS — Z6832 Body mass index (BMI) 32.0-32.9, adult: Secondary | ICD-10-CM | POA: Diagnosis not present

## 2018-01-27 DIAGNOSIS — Z01419 Encounter for gynecological examination (general) (routine) without abnormal findings: Secondary | ICD-10-CM | POA: Diagnosis not present

## 2018-02-25 DIAGNOSIS — Z299 Encounter for prophylactic measures, unspecified: Secondary | ICD-10-CM | POA: Diagnosis not present

## 2018-02-25 DIAGNOSIS — Z6833 Body mass index (BMI) 33.0-33.9, adult: Secondary | ICD-10-CM | POA: Diagnosis not present

## 2018-02-25 DIAGNOSIS — Z7189 Other specified counseling: Secondary | ICD-10-CM | POA: Diagnosis not present

## 2018-02-25 DIAGNOSIS — Z1331 Encounter for screening for depression: Secondary | ICD-10-CM | POA: Diagnosis not present

## 2018-02-25 DIAGNOSIS — Z0000 Encounter for general adult medical examination without abnormal findings: Secondary | ICD-10-CM | POA: Diagnosis not present

## 2018-02-25 DIAGNOSIS — Z1339 Encounter for screening examination for other mental health and behavioral disorders: Secondary | ICD-10-CM | POA: Diagnosis not present

## 2018-02-25 DIAGNOSIS — R05 Cough: Secondary | ICD-10-CM | POA: Diagnosis not present

## 2018-02-26 DIAGNOSIS — F410 Panic disorder [episodic paroxysmal anxiety] without agoraphobia: Secondary | ICD-10-CM | POA: Diagnosis not present

## 2018-02-26 DIAGNOSIS — Z79899 Other long term (current) drug therapy: Secondary | ICD-10-CM | POA: Diagnosis not present

## 2018-02-26 DIAGNOSIS — R5383 Other fatigue: Secondary | ICD-10-CM | POA: Diagnosis not present

## 2018-02-26 DIAGNOSIS — E7800 Pure hypercholesterolemia, unspecified: Secondary | ICD-10-CM | POA: Diagnosis not present

## 2018-03-02 DIAGNOSIS — M25579 Pain in unspecified ankle and joints of unspecified foot: Secondary | ICD-10-CM | POA: Diagnosis not present

## 2018-03-02 DIAGNOSIS — M79672 Pain in left foot: Secondary | ICD-10-CM | POA: Diagnosis not present

## 2018-03-19 DIAGNOSIS — E2839 Other primary ovarian failure: Secondary | ICD-10-CM | POA: Diagnosis not present

## 2018-04-13 DIAGNOSIS — Z1231 Encounter for screening mammogram for malignant neoplasm of breast: Secondary | ICD-10-CM | POA: Diagnosis not present

## 2018-05-14 DIAGNOSIS — M1712 Unilateral primary osteoarthritis, left knee: Secondary | ICD-10-CM | POA: Diagnosis not present

## 2018-06-04 DIAGNOSIS — F332 Major depressive disorder, recurrent severe without psychotic features: Secondary | ICD-10-CM | POA: Diagnosis not present

## 2018-06-04 DIAGNOSIS — M25562 Pain in left knee: Secondary | ICD-10-CM | POA: Diagnosis not present

## 2018-06-04 DIAGNOSIS — Z6832 Body mass index (BMI) 32.0-32.9, adult: Secondary | ICD-10-CM | POA: Diagnosis not present

## 2018-06-04 DIAGNOSIS — I10 Essential (primary) hypertension: Secondary | ICD-10-CM | POA: Diagnosis not present

## 2018-06-04 DIAGNOSIS — Z299 Encounter for prophylactic measures, unspecified: Secondary | ICD-10-CM | POA: Diagnosis not present

## 2018-06-04 DIAGNOSIS — M25512 Pain in left shoulder: Secondary | ICD-10-CM | POA: Diagnosis not present

## 2018-06-18 NOTE — Progress Notes (Signed)
Office Visit Note  Patient: Jane [name]             Date of Birth: [birth year]           MRN: [id number]             PCP: Ignatius Specking, MD Referring: Ignatius Specking, MD Visit Date: 07/02/2018 Occupation: @GUAROCC @  Subjective:  Pain in hands.   History of Present Illness: Jane [name] is a 77 y.o. female story of osteoarthritis.  She states she had good response to left third PIP joint.  She has some some limitation of mobility.  She does have osteoarthritis in her right joint which causes some discomfort.  She had left total knee replacement in the past and it is a still quite painful for her.  She states she has limited mobility due to her left knee joint discomfort.  She also has some osteoarthritis in her feet.  She is not having much discomfort in her C-spine currently.  Activities of Daily Living:  Patient reports morning stiffness for 30 minutes.   Patient Denies nocturnal pain.  Difficulty dressing/grooming: Reports Difficulty climbing stairs: Reports Difficulty getting out of chair: Denies Difficulty using hands for taps, buttons, cutlery, and/or writing: Reports  Review of Systems  Constitutional: Positive for fatigue.  HENT: Negative for mouth sores, trouble swallowing, trouble swallowing and mouth dryness.   Eyes: Negative for pain, redness, itching and dryness.  Respiratory: Negative for shortness of breath, wheezing and difficulty breathing.   Cardiovascular: Negative for chest pain, palpitations, irregular heartbeat and swelling in legs/feet.  Gastrointestinal: Positive for constipation. Negative for abdominal pain, diarrhea, nausea and vomiting.  Endocrine: Negative for increased urination.  Genitourinary: Negative for painful urination, nocturia and pelvic pain.  Musculoskeletal: Positive for arthralgias, joint pain and morning stiffness. Negative for joint swelling.  Skin: Negative for rash and hair loss.  Allergic/Immunologic: Negative for susceptible to  infections.  Neurological: Negative for dizziness, light-headedness, headaches, memory loss and weakness.  Hematological: Positive for bruising/bleeding tendency.  Psychiatric/Behavioral: Negative for confusion.    PMFS History:  Patient Active Problem List   Diagnosis Date Noted  . Hx of colonic polyps 06/13/2017  . Family history of colon cancer 06/13/2017  . OA (osteoarthritis) of knee 12/16/2016    Past Medical History:  Diagnosis Date  . Arthritis   . Depression   . H/O: rheumatic fever    as child  . Heart murmur   . Hx of colonic polyps   . Hypertension     History reviewed. No pertinent family history. Past Surgical History:  Procedure Laterality Date  . CARDIAC CATHETERIZATION    . COLONOSCOPY  06/03/2012   Procedure: COLONOSCOPY;  Surgeon: Malissa Hippo, MD;  Location: [facility];  Service: Endoscopy;  Laterality: N/A;  1200  . COLONOSCOPY N/A 11/26/2017   Procedure: COLONOSCOPY;  Surgeon: Malissa Hippo, MD;  Location: [facility];  Service: Endoscopy;  Laterality: N/A;  830  . FACIAL COSMETIC SURGERY    . RHINOPLASTY    . TOE SURGERY    . TONSILLECTOMY    . TOTAL KNEE ARTHROPLASTY Left 12/16/2016   Procedure: LEFT TOTAL KNEE ARTHROPLASTY;  Surgeon: Ollen Gross, MD;  Location: [facility];  Service: Orthopedics;  Laterality: Left;  requests  . TUBAL LIGATION     Social History   Social History Narrative  . Not on file    Objective: Vital Signs: BP (!) 161/82 (BP Location: Left Arm,  Patient Position: Sitting, Cuff Size: Normal)   Pulse 66   Resp 13   Ht 5' 1.5" (1.562 m)   Wt 177 lb 9.6 oz (80.6 kg)   BMI 33.01 kg/m    Physical Exam  Constitutional: She is oriented to person, place, and time. She appears well-developed and well-nourished.  HENT:  Head: Normocephalic and atraumatic.  Eyes: Conjunctivae and EOM are normal.  Neck: Normal range of motion.  Cardiovascular: Normal rate, regular rhythm, normal heart sounds and intact distal  pulses.  Pulmonary/Chest: Effort normal and breath sounds normal.  Abdominal: Soft. Bowel sounds are normal.  Lymphadenopathy:    She has no cervical adenopathy.  Neurological: She is alert and oriented to person, place, and time.  Skin: Skin is warm and dry. Capillary refill takes less than 2 seconds.  Psychiatric: She has a normal mood and affect. Her behavior is normal.  Nursing note and vitals reviewed.    Musculoskeletal Exam: C-spine some limitation with range of motion.  She has mild thoracic kyphosis.  Shoulder joints elbow joints wrist joints were in good range of motion.  She is in complete fist formation in her left hand due to left third PIP swelling.  Hip joints were in good range of motion.  She has warmth and discomfort range of motion of her left knee which is been replaced.  Right knee joint has crepitus without any warmth or swelling.  She has some osteoarthritic changes in her feet.  CDAI Exam: CDAI Score: Not documented Patient Global Assessment: Not documented; Provider Global Assessment: Not documented Swollen: Not documented; Tender: Not documented Joint Exam   Not documented   There is currently no information documented on the homunculus. Go to the Rheumatology activity and complete the homunculus joint exam.  Investigation: No additional findings.  Imaging: No results found.  Recent Labs: Lab Results  Component Value Date   WBC 10.3 10/02/2017   HGB 12.6 11/26/2017   PLT 224 10/02/2017   NA 131 (L) 10/02/2017   K 3.6 10/02/2017   CL 94 (L) 10/02/2017   CO2 26 10/02/2017   GLUCOSE 113 (H) 10/02/2017   BUN 21 (H) 10/02/2017   CREATININE 0.90 10/02/2017   BILITOT 0.6 12/11/2016   ALKPHOS 58 12/11/2016   AST 22 12/11/2016   ALT 22 12/11/2016   PROT 7.2 12/11/2016   ALBUMIN 4.5 12/11/2016   CALCIUM 9.1 10/02/2017   GFRAA >60 10/02/2017    Speciality Comments: No specialty comments available.  Procedures:  No procedures performed Allergies:  Ciprofloxacin hcl; Clindamycin/lincomycin; Flagyl [metronidazole]; Lodine [etodolac]; Other; and Penicillins   Assessment / Plan:     Visit Diagnoses: Primary osteoarthritis of both hands-she continues to have some pain and stiffness in her hands.  She had good response to left third PIP injection in the past.  She has some swelling in the left third PIP now.  She would like to wait to have another cortisone injection at this point.  Primary osteoarthritis of right knee-she has some chronic discomfort but is tolerable.  H/O total knee replacement, left - 12/16/2016 by Dr. Despina Hick.  She continues to have pain in her left knee joint and she was quite tearful and frustrated about it.  She is planning to have a second opinion.  She also discussed possible nerve block.  She will discuss that further with Dr. Lennie Odor.  Weight loss diet and exercise was discussed.  Primary osteoarthritis of both feet - calcaneal spurs.  Age-related osteoporosis without current pathological  fracture - Patient reports her most recent bone density showed T score of -2.5. At this point she is not ready to start on any medication.  DDD (degenerative disc disease), cervical -she has minimal discomfort.  History of depression-she was quite tearful during the conversation today.  She states her depression is related to chronic pain.  History of TIA (transient ischemic attack)  History of hypertension-blood pressure was elevated.  Have advised her to monitor blood pressure closely and follow-up with her PCP.  History of hypercholesterolemia  History of colon polyps   Orders: No orders of the defined types were placed in this encounter.  No orders of the defined types were placed in this encounter.   Face-to-face time spent with patient was 30 minutes. Greater than 50% of time was spent in counseling and coordination of care.  Follow-Up Instructions: Return in about 6 months (around 01/01/2019) for  Osteoarthritis.   Pollyann Savoy, MD  Note - This record has been created using Animal nutritionist.  Chart creation errors have been sought, but may not always  have been located. Such creation errors do not reflect on  the standard of medical care.

## 2018-06-25 ENCOUNTER — Ambulatory Visit: Payer: Medicare Other | Admitting: Rheumatology

## 2018-06-30 DIAGNOSIS — Z23 Encounter for immunization: Secondary | ICD-10-CM | POA: Diagnosis not present

## 2018-07-02 ENCOUNTER — Encounter: Payer: Self-pay | Admitting: Rheumatology

## 2018-07-02 ENCOUNTER — Ambulatory Visit (INDEPENDENT_AMBULATORY_CARE_PROVIDER_SITE_OTHER): Payer: Medicare Other | Admitting: Rheumatology

## 2018-07-02 VITALS — BP 161/82 | HR 66 | Resp 13 | Ht 61.5 in | Wt 177.6 lb

## 2018-07-02 DIAGNOSIS — Z860100 Personal history of colon polyps, unspecified: Secondary | ICD-10-CM

## 2018-07-02 DIAGNOSIS — M19041 Primary osteoarthritis, right hand: Secondary | ICD-10-CM | POA: Diagnosis not present

## 2018-07-02 DIAGNOSIS — Z8659 Personal history of other mental and behavioral disorders: Secondary | ICD-10-CM | POA: Diagnosis not present

## 2018-07-02 DIAGNOSIS — M19042 Primary osteoarthritis, left hand: Secondary | ICD-10-CM | POA: Diagnosis not present

## 2018-07-02 DIAGNOSIS — Z8639 Personal history of other endocrine, nutritional and metabolic disease: Secondary | ICD-10-CM

## 2018-07-02 DIAGNOSIS — M19071 Primary osteoarthritis, right ankle and foot: Secondary | ICD-10-CM

## 2018-07-02 DIAGNOSIS — Z96652 Presence of left artificial knee joint: Secondary | ICD-10-CM | POA: Diagnosis not present

## 2018-07-02 DIAGNOSIS — Z8679 Personal history of other diseases of the circulatory system: Secondary | ICD-10-CM

## 2018-07-02 DIAGNOSIS — M5030 Other cervical disc degeneration, unspecified cervical region: Secondary | ICD-10-CM

## 2018-07-02 DIAGNOSIS — M810 Age-related osteoporosis without current pathological fracture: Secondary | ICD-10-CM | POA: Diagnosis not present

## 2018-07-02 DIAGNOSIS — Z8673 Personal history of transient ischemic attack (TIA), and cerebral infarction without residual deficits: Secondary | ICD-10-CM

## 2018-07-02 DIAGNOSIS — M1711 Unilateral primary osteoarthritis, right knee: Secondary | ICD-10-CM | POA: Diagnosis not present

## 2018-07-02 DIAGNOSIS — M19072 Primary osteoarthritis, left ankle and foot: Secondary | ICD-10-CM

## 2018-07-10 DIAGNOSIS — M25562 Pain in left knee: Secondary | ICD-10-CM | POA: Diagnosis not present

## 2018-07-13 ENCOUNTER — Ambulatory Visit (INDEPENDENT_AMBULATORY_CARE_PROVIDER_SITE_OTHER): Payer: Medicare Other | Admitting: Cardiology

## 2018-07-13 ENCOUNTER — Encounter: Payer: Self-pay | Admitting: Cardiology

## 2018-07-13 ENCOUNTER — Encounter: Payer: Self-pay | Admitting: *Deleted

## 2018-07-13 VITALS — BP 160/80 | HR 67 | Ht 62.0 in | Wt 179.8 lb

## 2018-07-13 DIAGNOSIS — I10 Essential (primary) hypertension: Secondary | ICD-10-CM

## 2018-07-13 DIAGNOSIS — I38 Endocarditis, valve unspecified: Secondary | ICD-10-CM | POA: Diagnosis not present

## 2018-07-13 DIAGNOSIS — I447 Left bundle-branch block, unspecified: Secondary | ICD-10-CM | POA: Diagnosis not present

## 2018-07-13 MED ORDER — AMLODIPINE BESYLATE 5 MG PO TABS: 5.0000 mg | ORAL_TABLET | Freq: Every day | ORAL | 1 refills | Status: DC

## 2018-07-13 NOTE — Progress Notes (Signed)
Clinical Summary Jane [name] is a 77 y.o.female  seen today for follow up of the following medical problems.    1. Chronic LBBB - reports history 20 years of LBBB, she reports iniitally LBBB was rate related. Diagnosed in 1996, at that time rates 160-170 on treadmill stress she developed LBBB - heart cath 2005 was normal  - 11/2016 echo LVEF 55-60%.  - 12/2016 nuclear stress test no ischemia - no recent chest pain or SOB   2. Valvular heart disease - 11/2016 echo mild to mod MR, moderate TR - no recent symptoms  No recent SOB or DOE. No edema   3. HTN - does not check regularly at home.  - compliant with meds  4. Chronic knee pains   Past Medical History:  Diagnosis Date  . Arthritis   . Depression   . H/O: rheumatic fever    as child  . Heart murmur   . Hx of colonic polyps   . Hypertension      Allergies  Allergen Reactions  . Ciprofloxacin Hcl Other (See Comments)    Hand stiffness   . Clindamycin/Lincomycin Other (See Comments)    Per patient, give me c-diff  . Flagyl [Metronidazole] Hives and Other (See Comments)    Questionable if this medication was the cause.  . Lodine [Etodolac] Other (See Comments)    Stomach pain   . Other Other (See Comments)    Has a very low tolerance to sedation and stimulating drugs   . Penicillins Diarrhea, Rash and Other (See Comments)    Has patient had a PCN reaction causing immediate rash, facial/tongue/throat swelling, SOB or lightheadedness with hypotension: unknown Has patient had a PCN reaction causing severe rash involving mucus membranes or skin necrosis: unknown Has patient had a PCN reaction that required hospitalization unknown Has patient had a PCN reaction occurring within the last 10 years: No If all of the above answers are "NO", then may proceed with Cephalosporin use.      Current Outpatient Medications  Medication Sig Dispense Refill  . acetaminophen (TYLENOL) 500 MG tablet Take 1,000 mg by  mouth every 6 (six) hours as needed for moderate pain or headache.     Marland Kitchen acetaminophen (TYLENOL) 650 MG CR tablet Take 650 mg by mouth daily.    Marland Kitchen aspirin EC 81 MG tablet Take 81 mg by mouth at bedtime.    . Calcium Carbonate-Vitamin D (CALTRATE 600+D PO) Take 1 tablet by mouth 2 (two) times daily.    . Cholecalciferol (VITAMIN D3) 2000 units TABS Take 2,000 Units by mouth daily.    . citalopram (CELEXA) 40 MG tablet Take 40 mg by mouth at bedtime.     . diclofenac sodium (VOLTAREN) 1 % GEL Apply to affected area 3-4 times daily PRN (Patient taking differently: Apply 1 application topically 4 (four) times daily as needed (for pain). ) 3 Tube 3  . gabapentin (NEURONTIN) 300 MG capsule daily.  0  . lisinopril (PRINIVIL,ZESTRIL) 20 MG tablet Take 20 mg by mouth 2 (two) times daily.    . magnesium oxide (MAG-OX) 400 MG tablet Take 400 mg by mouth daily.    . meclizine (ANTIVERT) 25 MG tablet Take 12.5-25 mg by mouth daily as needed for dizziness.     . Multiple Vitamins-Minerals (CENTRUM SILVER PO) Take 1 tablet by mouth daily.    . Omega-3 Fatty Acids (FISH OIL PO) Take 2 capsules by mouth daily.    . polyethylene glycol-electrolytes (  NULYTELY/GOLYTELY) 420 g solution Take 4,000 mLs by mouth once.  0  . simvastatin (ZOCOR) 20 MG tablet Take 20 mg by mouth at bedtime.    . Tetrahydrozoline HCl (MURINE FOR RED EYES OP) Place 1 drop into both eyes daily as needed (for red/dry eyes).     No current facility-administered medications for this visit.      Past Surgical History:  Procedure Laterality Date  . CARDIAC CATHETERIZATION    . COLONOSCOPY  06/03/2012   Procedure: COLONOSCOPY;  Surgeon: Rogene Houston, MD;  [facility]: [facility];  Service: Endoscopy;  Laterality: N/A;  1200  . COLONOSCOPY N/A 11/26/2017   Procedure: COLONOSCOPY;  Surgeon: Rogene Houston, MD;  Location: [facility];  Service: Endoscopy;  Laterality: N/A;  830  . FACIAL COSMETIC SURGERY    . RHINOPLASTY    . TOE  SURGERY    . TONSILLECTOMY    . TOTAL KNEE ARTHROPLASTY Left 12/16/2016   Procedure: LEFT TOTAL KNEE ARTHROPLASTY;  Surgeon: Gaynelle Arabian, MD;  Location: [facility];  Service: Orthopedics;  Laterality: Left;  requests 21mins  . TUBAL LIGATION       Allergies  Allergen Reactions  . Ciprofloxacin Hcl Other (See Comments)    Hand stiffness   . Clindamycin/Lincomycin Other (See Comments)    Per patient, give me c-diff  . Flagyl [Metronidazole] Hives and Other (See Comments)    Questionable if this medication was the cause.  . Lodine [Etodolac] Other (See Comments)    Stomach pain   . Other Other (See Comments)    Has a very low tolerance to sedation and stimulating drugs   . Penicillins Diarrhea, Rash and Other (See Comments)    Has patient had a PCN reaction causing immediate rash, facial/tongue/throat swelling, SOB or lightheadedness with hypotension: unknown Has patient had a PCN reaction causing severe rash involving mucus membranes or skin necrosis: unknown Has patient had a PCN reaction that required hospitalization unknown Has patient had a PCN reaction occurring within the last 10 years: No If all of the above answers are "NO", then may proceed with Cephalosporin use.       No family history on file.   Social History Jane [name] reports that she has never smoked. She has never used smokeless tobacco. Jane [name] reports that she drinks alcohol.   Review of Systems CONSTITUTIONAL: No weight loss, fever, chills, weakness or fatigue.  HEENT: Eyes: No visual loss, blurred vision, double vision or yellow sclerae.No hearing loss, sneezing, congestion, runny nose or sore throat.  SKIN: No rash or itching.  CARDIOVASCULAR: per hpi RESPIRATORY: No shortness of breath, cough or sputum.  GASTROINTESTINAL: No anorexia, nausea, vomiting or diarrhea. No abdominal pain or blood.  GENITOURINARY: No burning on urination, no polyuria NEUROLOGICAL: No headache, dizziness, syncope, paralysis,  ataxia, numbness or tingling in the extremities. No change in bowel or bladder control.  MUSCULOSKELETAL: per hpi LYMPHATICS: No enlarged nodes. No history of splenectomy.  PSYCHIATRIC: No history of depression or anxiety.  ENDOCRINOLOGIC: No reports of sweating, cold or heat intolerance. No polyuria or polydipsia.  Marland Kitchen   Physical Examination Vitals:   07/13/18 1502  BP: (!) 160/80  Pulse: 67  SpO2: 98%   Vitals:   07/13/18 1502  Weight: 179 lb 12.8 oz (81.6 kg)  Height: 5\' 2"  (1.575 m)    Gen: resting comfortably, no acute distress HEENT: no scleral icterus, pupils equal round and reactive, no palptable cervical adenopathy,  CV: RRR, no m/r/g,  no jvd. Mild bilateral carotid bruits.  Resp: Clear to auscultation bilaterally GI: abdomen is soft, non-tender, non-distended, normal bowel sounds, no hepatosplenomegaly MSK: extremities are warm, no edema.  Skin: warm, no rash Neuro:  no focal deficits Psych: appropriate affect   Diagnostic Studies 11/2016 echo [facility] LVEF 55-60%,   12/2016 Nuclear stress  The study is normal.  This is a low risk study.  The left ventricular ejection fraction is normal (55-65%).  There was no ST segment deviation noted during stress.  Normal resting and stress perfusion. No ischemia or infarction EF 54%     Assessment and Plan  1.Chronic LBBB - no evidence of underlying significant heart disease by extensive workup last year -continue to monitor.   2. Valvular heart disease - mild to mod MR, mod TR by last echo - no symptoms, continue to monitor at this time   3. HTN - above goal, manual bp 158/75.  - start norvasc 5mg  daily, check bp in 2 weeks with nursing visit.   F/u 1 year      Arnoldo Lenis, M.D.

## 2018-07-13 NOTE — Patient Instructions (Addendum)
Medication Instructions:   Your physician has recommended you make the following change in your medication:   Start amlodipine 5 mg by mouth daily.  Continue all other medications the same.  Labwork:  NONE  Testing/Procedures:  NONE  Follow-Up:  Your physician recommends that you schedule a follow-up appointment in: 1 year. You will receive a reminder letter in the mail in about 10 months reminding you to call and schedule your appointment. If you don't receive this letter, please contact our office.  Your physician recommends that you schedule a follow-up appointment in: 2 weeks for a nurse blood pressure check.  Any Other Special Instructions Will Be Listed Below (If Applicable).  If you need a refill on your cardiac medications before your next appointment, please call your pharmacy.

## 2018-07-27 ENCOUNTER — Ambulatory Visit: Payer: Medicare Other

## 2018-07-28 DIAGNOSIS — M25562 Pain in left knee: Secondary | ICD-10-CM | POA: Diagnosis not present

## 2018-07-29 ENCOUNTER — Ambulatory Visit: Payer: Medicare Other

## 2018-08-03 ENCOUNTER — Telehealth: Payer: Self-pay | Admitting: Cardiology

## 2018-08-03 ENCOUNTER — Ambulatory Visit (INDEPENDENT_AMBULATORY_CARE_PROVIDER_SITE_OTHER): Payer: Medicare Other | Admitting: *Deleted

## 2018-08-03 VITALS — BP 128/72 | HR 71

## 2018-08-03 DIAGNOSIS — I10 Essential (primary) hypertension: Secondary | ICD-10-CM

## 2018-08-03 MED ORDER — AMLODIPINE BESYLATE 5 MG PO TABS: 5.0000 mg | ORAL_TABLET | Freq: Every day | ORAL | 3 refills | Status: DC

## 2018-08-03 NOTE — Telephone Encounter (Signed)
Patient had an additional question about her prescriptions.

## 2018-08-03 NOTE — Progress Notes (Signed)
Patient in office for BP check.  Recently started Amlodipine 5mg  daily.  Tolerating medication well at this time.

## 2018-08-03 NOTE — Progress Notes (Signed)
Readings much improved. Continue current medication regimen.   Thanks,  [place]

## 2018-08-03 NOTE — Telephone Encounter (Signed)
Amlodipine sent to [facility] for 90 day supply at patient request.

## 2018-08-17 DIAGNOSIS — M25562 Pain in left knee: Secondary | ICD-10-CM | POA: Diagnosis not present

## 2018-09-14 ENCOUNTER — Telehealth: Payer: Self-pay | Admitting: Cardiology

## 2018-09-14 DIAGNOSIS — I10 Essential (primary) hypertension: Secondary | ICD-10-CM

## 2018-09-14 NOTE — Telephone Encounter (Signed)
Patient called stating that she is having constipation due to the medication Amlodipine.

## 2018-09-14 NOTE — Telephone Encounter (Signed)
Has noticed that she has been constipated - started around November and she can only relate this to the new medication (Amlodipine).  Stated that she does take a Tylenol daily.  Stated that she has started trying to use suppository and Dulcolax.  Has recently started her Metamucil back & always drinks lots of water.

## 2018-09-15 ENCOUNTER — Other Ambulatory Visit: Payer: Self-pay | Admitting: *Deleted

## 2018-09-15 DIAGNOSIS — I10 Essential (primary) hypertension: Secondary | ICD-10-CM

## 2018-09-15 MED ORDER — CHLORTHALIDONE 25 MG PO TABS: 12.5000 mg | ORAL_TABLET | Freq: Every day | ORAL | 3 refills | Status: DC

## 2018-09-15 NOTE — Telephone Encounter (Signed)
Can stop norvasc, start chlorthalidone 12.5mg  daily. Check BMET/Mg in 2 weeks   Zandra Abts MD

## 2018-09-15 NOTE — Telephone Encounter (Signed)
Patient informed and verbalized understanding of plan. Lab order faxed to [facility] per patient request.

## 2018-09-17 DIAGNOSIS — I10 Essential (primary) hypertension: Secondary | ICD-10-CM | POA: Diagnosis not present

## 2018-09-17 DIAGNOSIS — Z299 Encounter for prophylactic measures, unspecified: Secondary | ICD-10-CM | POA: Diagnosis not present

## 2018-09-17 DIAGNOSIS — Z6832 Body mass index (BMI) 32.0-32.9, adult: Secondary | ICD-10-CM | POA: Diagnosis not present

## 2018-09-17 DIAGNOSIS — F332 Major depressive disorder, recurrent severe without psychotic features: Secondary | ICD-10-CM | POA: Diagnosis not present

## 2018-09-17 DIAGNOSIS — I471 Supraventricular tachycardia: Secondary | ICD-10-CM | POA: Diagnosis not present

## 2018-09-17 DIAGNOSIS — M25562 Pain in left knee: Secondary | ICD-10-CM | POA: Diagnosis not present

## 2018-09-18 ENCOUNTER — Telehealth: Payer: Self-pay | Admitting: Cardiology

## 2018-09-18 NOTE — Telephone Encounter (Signed)
Patient has decided not to start chlorthalidone at this time stating her anxiety level is very high and the chlorthalidone is tier 2 or 3 on her insurance and she wouldn't be able to afford it. Patient advised that she can pay cash pay price and it would be cheaper. Patient still declined. Patient says that Dr Woody Seller is going to follow her very closely for the next 2 weeks and she just wanted make Dr.Branch aware that she is not starting chlorthalidone.

## 2018-09-18 NOTE — Telephone Encounter (Signed)
Patient called stating that her body is very sensitive to medications. Dr.Vyas  told patient to stop her medications. She has an upcoming appointment with Dr. Woody Seller.

## 2018-10-07 DIAGNOSIS — Z789 Other specified health status: Secondary | ICD-10-CM | POA: Diagnosis not present

## 2018-10-07 DIAGNOSIS — J029 Acute pharyngitis, unspecified: Secondary | ICD-10-CM | POA: Diagnosis not present

## 2018-10-07 DIAGNOSIS — F332 Major depressive disorder, recurrent severe without psychotic features: Secondary | ICD-10-CM | POA: Diagnosis not present

## 2018-10-07 DIAGNOSIS — Z1331 Encounter for screening for depression: Secondary | ICD-10-CM | POA: Diagnosis not present

## 2018-10-07 DIAGNOSIS — I10 Essential (primary) hypertension: Secondary | ICD-10-CM | POA: Diagnosis not present

## 2018-10-07 DIAGNOSIS — Z6832 Body mass index (BMI) 32.0-32.9, adult: Secondary | ICD-10-CM | POA: Diagnosis not present

## 2018-10-07 DIAGNOSIS — I471 Supraventricular tachycardia: Secondary | ICD-10-CM | POA: Diagnosis not present

## 2018-10-07 DIAGNOSIS — Z299 Encounter for prophylactic measures, unspecified: Secondary | ICD-10-CM | POA: Diagnosis not present

## 2018-10-13 DIAGNOSIS — Z96652 Presence of left artificial knee joint: Secondary | ICD-10-CM | POA: Diagnosis not present

## 2018-10-13 DIAGNOSIS — Z96651 Presence of right artificial knee joint: Secondary | ICD-10-CM | POA: Diagnosis not present

## 2018-10-13 DIAGNOSIS — M1712 Unilateral primary osteoarthritis, left knee: Secondary | ICD-10-CM | POA: Diagnosis not present

## 2018-10-15 DIAGNOSIS — F332 Major depressive disorder, recurrent severe without psychotic features: Secondary | ICD-10-CM | POA: Diagnosis not present

## 2018-10-15 DIAGNOSIS — I10 Essential (primary) hypertension: Secondary | ICD-10-CM | POA: Diagnosis not present

## 2018-10-15 DIAGNOSIS — E7800 Pure hypercholesterolemia, unspecified: Secondary | ICD-10-CM | POA: Diagnosis not present

## 2018-10-15 DIAGNOSIS — Z299 Encounter for prophylactic measures, unspecified: Secondary | ICD-10-CM | POA: Diagnosis not present

## 2018-10-15 DIAGNOSIS — I471 Supraventricular tachycardia: Secondary | ICD-10-CM | POA: Diagnosis not present

## 2018-11-10 DIAGNOSIS — I10 Essential (primary) hypertension: Secondary | ICD-10-CM | POA: Diagnosis not present

## 2018-11-10 DIAGNOSIS — Z6832 Body mass index (BMI) 32.0-32.9, adult: Secondary | ICD-10-CM | POA: Diagnosis not present

## 2018-11-10 DIAGNOSIS — F332 Major depressive disorder, recurrent severe without psychotic features: Secondary | ICD-10-CM | POA: Diagnosis not present

## 2018-11-10 DIAGNOSIS — Z299 Encounter for prophylactic measures, unspecified: Secondary | ICD-10-CM | POA: Diagnosis not present

## 2018-11-10 DIAGNOSIS — D492 Neoplasm of unspecified behavior of bone, soft tissue, and skin: Secondary | ICD-10-CM | POA: Diagnosis not present

## 2019-01-01 ENCOUNTER — Ambulatory Visit: Payer: Medicare Other | Admitting: Rheumatology

## 2019-01-05 DIAGNOSIS — S0003XA Contusion of scalp, initial encounter: Secondary | ICD-10-CM | POA: Diagnosis not present

## 2019-01-05 DIAGNOSIS — S0990XA Unspecified injury of head, initial encounter: Secondary | ICD-10-CM | POA: Diagnosis not present

## 2019-01-05 DIAGNOSIS — Z23 Encounter for immunization: Secondary | ICD-10-CM | POA: Diagnosis not present

## 2019-01-05 DIAGNOSIS — W19XXXA Unspecified fall, initial encounter: Secondary | ICD-10-CM | POA: Diagnosis not present

## 2019-01-05 DIAGNOSIS — S6992XA Unspecified injury of left wrist, hand and finger(s), initial encounter: Secondary | ICD-10-CM | POA: Diagnosis not present

## 2019-01-05 DIAGNOSIS — S61412A Laceration without foreign body of left hand, initial encounter: Secondary | ICD-10-CM | POA: Diagnosis not present

## 2019-01-05 DIAGNOSIS — Z7982 Long term (current) use of aspirin: Secondary | ICD-10-CM | POA: Diagnosis not present

## 2019-01-05 DIAGNOSIS — R58 Hemorrhage, not elsewhere classified: Secondary | ICD-10-CM | POA: Diagnosis not present

## 2019-01-05 DIAGNOSIS — W1839XA Other fall on same level, initial encounter: Secondary | ICD-10-CM | POA: Diagnosis not present

## 2019-01-05 DIAGNOSIS — I10 Essential (primary) hypertension: Secondary | ICD-10-CM | POA: Diagnosis not present

## 2019-01-05 DIAGNOSIS — Z79899 Other long term (current) drug therapy: Secondary | ICD-10-CM | POA: Diagnosis not present

## 2019-01-05 DIAGNOSIS — S60222A Contusion of left hand, initial encounter: Secondary | ICD-10-CM | POA: Diagnosis not present

## 2019-01-07 DIAGNOSIS — F332 Major depressive disorder, recurrent severe without psychotic features: Secondary | ICD-10-CM | POA: Diagnosis not present

## 2019-01-07 DIAGNOSIS — I10 Essential (primary) hypertension: Secondary | ICD-10-CM | POA: Diagnosis not present

## 2019-01-07 DIAGNOSIS — Z6832 Body mass index (BMI) 32.0-32.9, adult: Secondary | ICD-10-CM | POA: Diagnosis not present

## 2019-01-07 DIAGNOSIS — S6992XS Unspecified injury of left wrist, hand and finger(s), sequela: Secondary | ICD-10-CM | POA: Diagnosis not present

## 2019-01-07 DIAGNOSIS — I471 Supraventricular tachycardia: Secondary | ICD-10-CM | POA: Diagnosis not present

## 2019-01-07 DIAGNOSIS — Z299 Encounter for prophylactic measures, unspecified: Secondary | ICD-10-CM | POA: Diagnosis not present

## 2019-01-15 DIAGNOSIS — M79642 Pain in left hand: Secondary | ICD-10-CM | POA: Diagnosis not present

## 2019-01-15 DIAGNOSIS — S61412A Laceration without foreign body of left hand, initial encounter: Secondary | ICD-10-CM | POA: Diagnosis not present

## 2019-01-15 DIAGNOSIS — S60922A Unspecified superficial injury of left hand, initial encounter: Secondary | ICD-10-CM | POA: Diagnosis not present

## 2019-01-19 DIAGNOSIS — S60922A Unspecified superficial injury of left hand, initial encounter: Secondary | ICD-10-CM | POA: Diagnosis not present

## 2019-01-19 DIAGNOSIS — S61412A Laceration without foreign body of left hand, initial encounter: Secondary | ICD-10-CM | POA: Diagnosis not present

## 2019-01-26 DIAGNOSIS — S61412A Laceration without foreign body of left hand, initial encounter: Secondary | ICD-10-CM | POA: Diagnosis not present

## 2019-01-26 DIAGNOSIS — S60922A Unspecified superficial injury of left hand, initial encounter: Secondary | ICD-10-CM | POA: Diagnosis not present

## 2019-02-03 DIAGNOSIS — Z299 Encounter for prophylactic measures, unspecified: Secondary | ICD-10-CM | POA: Diagnosis not present

## 2019-02-03 DIAGNOSIS — Z6832 Body mass index (BMI) 32.0-32.9, adult: Secondary | ICD-10-CM | POA: Diagnosis not present

## 2019-02-03 DIAGNOSIS — I471 Supraventricular tachycardia: Secondary | ICD-10-CM | POA: Diagnosis not present

## 2019-02-03 DIAGNOSIS — I10 Essential (primary) hypertension: Secondary | ICD-10-CM | POA: Diagnosis not present

## 2019-02-03 DIAGNOSIS — F332 Major depressive disorder, recurrent severe without psychotic features: Secondary | ICD-10-CM | POA: Diagnosis not present

## 2019-02-03 NOTE — Progress Notes (Deleted)
Office Visit Note  Patient: Jane [name]             Date of Birth: 1941-01-12           MRN: [id number]             PCP: Ignatius Specking, MD Referring: Ignatius Specking, MD Visit Date: 02/16/2019 Occupation: @GUAROCC @  Subjective:  No chief complaint on file.   History of Present Illness: Jane [name] is a 78 y.o. female ***   Activities of Daily Living:  Patient reports morning stiffness for *** {minute/hour:19697}.   Patient {ACTIONS;DENIES/REPORTS:21021675::"Denies"} nocturnal pain.  Difficulty dressing/grooming: {ACTIONS;DENIES/REPORTS:21021675::"Denies"} Difficulty climbing stairs: {ACTIONS;DENIES/REPORTS:21021675::"Denies"} Difficulty getting out of chair: {ACTIONS;DENIES/REPORTS:21021675::"Denies"} Difficulty using hands for taps, buttons, cutlery, and/or writing: {ACTIONS;DENIES/REPORTS:21021675::"Denies"}  No Rheumatology ROS completed.   PMFS History:  Patient Active Problem List   Diagnosis Date Noted  . Hx of colonic polyps 06/13/2017  . Family history of colon cancer 06/13/2017  . OA (osteoarthritis) of knee 12/16/2016    Past Medical History:  Diagnosis Date  . Arthritis   . Depression   . H/O: rheumatic fever    as child  . Heart murmur   . Hx of colonic polyps   . Hypertension     No family history on file. Past Surgical History:  Procedure Laterality Date  . CARDIAC CATHETERIZATION    . COLONOSCOPY  06/03/2012   Procedure: COLONOSCOPY;  Surgeon: Malissa Hippo, MD;  [facility];  Service: Endoscopy;  Laterality: N/A;  1200  . COLONOSCOPY N/A 11/26/2017   Procedure: COLONOSCOPY;  Surgeon: Malissa Hippo, MD;  [facility]: [facility];  Service: Endoscopy;  Laterality: N/A;  830  . FACIAL COSMETIC SURGERY    . RHINOPLASTY    . TOE SURGERY    . TONSILLECTOMY    . TOTAL KNEE ARTHROPLASTY Left 12/16/2016   Procedure: LEFT TOTAL KNEE ARTHROPLASTY;  Surgeon: Ollen Gross, MD;  Location: [facility];  Service: Orthopedics;  Laterality: Left;   requests  . TUBAL LIGATION     Social History   Social History Narrative  . Not on file    There is no immunization history on file for this patient.   Objective: Vital Signs: There were no vitals taken for this visit.   Physical Exam   Musculoskeletal Exam: ***  CDAI Exam: CDAI Score: Not documented Patient Global Assessment: Not documented; Provider Global Assessment: Not documented Swollen: Not documented; Tender: Not documented Joint Exam   Not documented   There is currently no information documented on the homunculus. Go to the Rheumatology activity and complete the homunculus joint exam.  Investigation: No additional findings.  Imaging: No results found.  Recent Labs: Lab Results  Component Value Date   WBC 10.3 10/02/2017   HGB 12.6 11/26/2017   PLT 224 10/02/2017   NA 131 (L) 10/02/2017   K 3.6 10/02/2017   CL 94 (L) 10/02/2017   CO2 26 10/02/2017   GLUCOSE 113 (H) 10/02/2017   BUN 21 (H) 10/02/2017   CREATININE 0.90 10/02/2017   BILITOT 0.6 12/11/2016   ALKPHOS 58 12/11/2016   AST 22 12/11/2016   ALT 22 12/11/2016   PROT 7.2 12/11/2016   ALBUMIN 4.5 12/11/2016   CALCIUM 9.1 10/02/2017   GFRAA >60 10/02/2017    Speciality Comments: No specialty comments available.  Procedures:  No procedures performed Allergies: Ciprofloxacin hcl; Clindamycin/lincomycin; Flagyl [metronidazole]; Gabapentin; Lodine [etodolac]; Other; and Penicillins   Assessment / Plan:  Visit Diagnoses: No diagnosis found.   Orders: No orders of the defined types were placed in this encounter.  No orders of the defined types were placed in this encounter.   Face-to-face time spent with patient was *** minutes. Greater than 50% of time was spent in counseling and coordination of care.  Follow-Up Instructions: No follow-ups on file.   Ellen Henri, CMA  Note - This record has been created using Animal nutritionist.  Chart creation errors have been sought,  but may not always  have been located. Such creation errors do not reflect on  the standard of medical care.

## 2019-02-05 DIAGNOSIS — S60922A Unspecified superficial injury of left hand, initial encounter: Secondary | ICD-10-CM | POA: Diagnosis not present

## 2019-02-05 DIAGNOSIS — S61412A Laceration without foreign body of left hand, initial encounter: Secondary | ICD-10-CM | POA: Diagnosis not present

## 2019-02-16 ENCOUNTER — Ambulatory Visit: Payer: Self-pay | Admitting: Rheumatology

## 2019-02-25 NOTE — Progress Notes (Signed)
Office Visit Note  Patient: Jane [name]             Date of Birth: 07-21-1941           MRN: [id number]             PCP: Ignatius Specking, MD Referring: Ignatius Specking, MD Visit Date: 03/09/2019 Occupation: @GUAROCC @  Subjective:  Pain in multiple joints.   History of Present Illness: Jane [name] is a 78 y.o. female with history of osteoarthritis, degenerative disc disease and osteoporosis.  She states about 2 months ago she fell on her left hand and had major injury.  She had suturing done at the emergency room in [place] and then was seen by hand surgeon at [facility].  She states she had several visits for the debridement of the area.  She also had antibiotics for the infection.  She still has follow-up pending to make a decision regarding future surgery on her hand.  She continues to have discomfort in her left knee joint which is been replaced in the past.  All other joints are stiff but not as painful.  She states her last bone density was consistent with a T score of -2.5 and her PCP decided to hold off treatment at this point.  Activities of Daily Living:  Patient reports morning stiffness for 15 minutes.   Patient Denies nocturnal pain.  Difficulty dressing/grooming: Reports Difficulty climbing stairs: Reports Difficulty getting out of chair: Reports Difficulty using hands for taps, buttons, cutlery, and/or writing: Reports  Review of Systems  Constitutional: Negative for fatigue, night sweats, weight gain and weight loss.  HENT: Negative for mouth sores, trouble swallowing, trouble swallowing, mouth dryness and nose dryness.   Eyes: Negative for pain, redness, visual disturbance and dryness.  Respiratory: Negative for cough, shortness of breath and difficulty breathing.   Cardiovascular: Negative for chest pain, palpitations, hypertension, irregular heartbeat and swelling in legs/feet.  Gastrointestinal: Negative for blood in stool, constipation and diarrhea.  Endocrine:  Negative for increased urination.  Genitourinary: Negative for vaginal dryness.  Musculoskeletal: Positive for arthralgias, joint pain and morning stiffness. Negative for joint swelling, myalgias, muscle weakness, muscle tenderness and myalgias.  Skin: Negative for color change, rash, hair loss, skin tightness, ulcers and sensitivity to sunlight.  Allergic/Immunologic: Negative for susceptible to infections.  Neurological: Negative for dizziness, memory loss, night sweats and weakness.  Hematological: Negative for swollen glands.  Psychiatric/Behavioral: Positive for depressed mood. Negative for sleep disturbance. The patient is nervous/anxious.     PMFS History:  Patient Active Problem List   Diagnosis Date Noted  . Primary osteoarthritis of both hands 03/09/2019  . Primary osteoarthritis of both feet 03/09/2019  . H/O total knee replacement, left 03/09/2019  . Age-related osteoporosis without current pathological fracture 03/09/2019  . DDD (degenerative disc disease), cervical 03/09/2019  . Hx of colonic polyps 06/13/2017  . Family history of colon cancer 06/13/2017  . OA (osteoarthritis) of knee 12/16/2016    Past Medical History:  Diagnosis Date  . Arthritis   . Depression   . H/O: rheumatic fever    as child  . Heart murmur   . Hx of colonic polyps   . Hypertension     Family History  Problem Relation Age of Onset  . Colon cancer Mother   . Heart Problems Father   . COPD Sister   . Hypertension Sister   . Diabetes Sister   . Thyroid disease Sister   .  Hypertension Sister   . Healthy Son   . Healthy Daughter    Past Surgical History:  Procedure Laterality Date  . CARDIAC CATHETERIZATION    . COLONOSCOPY  06/03/2012   Procedure: COLONOSCOPY;  Surgeon: Malissa Hippo, MD;  [facility];  Service: Endoscopy;  Laterality: N/A;  1200  . COLONOSCOPY N/A 11/26/2017   Procedure: COLONOSCOPY;  Surgeon: Malissa Hippo, MD;  [facility];  Service:  Endoscopy;  Laterality: N/A;  830  . FACIAL COSMETIC SURGERY    . RHINOPLASTY    . TOE SURGERY    . TONSILLECTOMY    . TOTAL KNEE ARTHROPLASTY Left 12/16/2016   Procedure: LEFT TOTAL KNEE ARTHROPLASTY;  Surgeon: Ollen Gross, MD;  Location: [facility];  Service: Orthopedics;  Laterality: Left;  requests  . TUBAL LIGATION     Social History   Social History Narrative  . Not on file    There is no immunization history on file for this patient.   Objective: Vital Signs: BP (!) 166/78 (BP Location: Left Arm, Patient Position: Sitting, Cuff Size: Normal)   Pulse 72   Resp 12   Ht 5\' 2"  (1.575 m)   Wt 178 lb 3.2 oz (80.8 kg)   BMI 32.59 kg/m    Physical Exam Vitals signs and nursing note reviewed.  Constitutional:      Appearance: She is well-developed.  HENT:     Head: Normocephalic and atraumatic.  Eyes:     Conjunctiva/sclera: Conjunctivae normal.  Neck:     Musculoskeletal: Normal range of motion.  Cardiovascular:     Rate and Rhythm: Normal rate and regular rhythm.     Heart sounds: Normal heart sounds.  Pulmonary:     Effort: Pulmonary effort is normal.     Breath sounds: Normal breath sounds.  Abdominal:     General: Bowel sounds are normal.     Palpations: Abdomen is soft.  Lymphadenopathy:     Cervical: No cervical adenopathy.  Skin:    General: Skin is warm and dry.     Capillary Refill: Capillary refill takes less than 2 seconds.  Neurological:     Mental Status: She is alert and oriented to person, place, and time.  Psychiatric:        Behavior: Behavior normal.      Musculoskeletal Exam: C-spine and lumbar spine were in good range of motion.  Shoulder joints and elbow joints with good range of motion.  She has some DIP PIP and CMC thickening bilaterally.  She has scarring on her left hand from recent fall.  Hip joints with good range of motion.  Her left knee joint is replaced without any effusion.  She has discomfort range of motion.  CDAI Exam:  CDAI Score: - Patient Global: -; Provider Global: - Swollen: -; Tender: - Joint Exam   No joint exam has been documented for this visit   There is currently no information documented on the homunculus. Go to the Rheumatology activity and complete the homunculus joint exam.  Investigation: No additional findings.  Imaging: No results found.  Recent Labs: Lab Results  Component Value Date   WBC 10.3 10/02/2017   HGB 12.6 11/26/2017   PLT 224 10/02/2017   NA 131 (L) 10/02/2017   K 3.6 10/02/2017   CL 94 (L) 10/02/2017   CO2 26 10/02/2017   GLUCOSE 113 (H) 10/02/2017   BUN 21 (H) 10/02/2017   CREATININE 0.90 10/02/2017   BILITOT  0.6 12/11/2016   ALKPHOS 58 12/11/2016   AST 22 12/11/2016   ALT 22 12/11/2016   PROT 7.2 12/11/2016   ALBUMIN 4.5 12/11/2016   CALCIUM 9.1 10/02/2017   GFRAA >60 10/02/2017    Speciality Comments: No specialty comments available.  Procedures:  No procedures performed Allergies: Ciprofloxacin hcl, Clindamycin/lincomycin, Flagyl [metronidazole], Gabapentin, Lodine [etodolac], Other, and Penicillins   Assessment / Plan:     Visit Diagnoses: Primary osteoarthritis of both hands -she continues to have some discomfort in her hands.  Joint protection muscle strengthening was discussed.  She will be starting rehab on her left hand for the recent injury.  Primary osteoarthritis of right knee -she is currently not having much discomfort.  H/O total knee replacement, left - 12/16/2016 by Dr. Despina Hick. -She continues to have discomfort in her left knee.  She states she had second opinion regarding it but had no benefit.  Weight loss diet and exercise was discussed.  Primary osteoarthritis of both feet - calcaneal spurs. - Plan: Proper fitting shoes were discussed.  DDD (degenerative disc disease), cervical -she had good range of motion without discomfort.  Age-related osteoporosis without current pathological fracture - Patient reports her most recent  bone density showed T score of -2.5. At this point she is not ready to start on any medication.   History of hypertension -blood pressure is elevated today.  Have advised her to monitor blood pressure closely and follow-up with her PCP.  Other medical problems are listed as follows:  History of hypercholesterolemia   History of TIA (transient ischemic attack)   History of depression   History of colon polyps   Orders: No orders of the defined types were placed in this encounter.  No orders of the defined types were placed in this encounter.  .  Follow-Up Instructions: Return in about 1 year (around 03/08/2020) for Osteoarthritis, Osteoporosis.   Pollyann Savoy, MD  Note - This record has been created using Animal nutritionist.  Chart creation errors have been sought, but may not always  have been located. Such creation errors do not reflect on  the standard of medical care.

## 2019-03-03 DIAGNOSIS — Z6832 Body mass index (BMI) 32.0-32.9, adult: Secondary | ICD-10-CM | POA: Diagnosis not present

## 2019-03-03 DIAGNOSIS — I10 Essential (primary) hypertension: Secondary | ICD-10-CM | POA: Diagnosis not present

## 2019-03-03 DIAGNOSIS — E7800 Pure hypercholesterolemia, unspecified: Secondary | ICD-10-CM | POA: Diagnosis not present

## 2019-03-03 DIAGNOSIS — R5383 Other fatigue: Secondary | ICD-10-CM | POA: Diagnosis not present

## 2019-03-03 DIAGNOSIS — Z1211 Encounter for screening for malignant neoplasm of colon: Secondary | ICD-10-CM | POA: Diagnosis not present

## 2019-03-03 DIAGNOSIS — Z1339 Encounter for screening examination for other mental health and behavioral disorders: Secondary | ICD-10-CM | POA: Diagnosis not present

## 2019-03-03 DIAGNOSIS — Z299 Encounter for prophylactic measures, unspecified: Secondary | ICD-10-CM | POA: Diagnosis not present

## 2019-03-03 DIAGNOSIS — Z7189 Other specified counseling: Secondary | ICD-10-CM | POA: Diagnosis not present

## 2019-03-03 DIAGNOSIS — Z0000 Encounter for general adult medical examination without abnormal findings: Secondary | ICD-10-CM | POA: Diagnosis not present

## 2019-03-03 DIAGNOSIS — Z1331 Encounter for screening for depression: Secondary | ICD-10-CM | POA: Diagnosis not present

## 2019-03-04 DIAGNOSIS — I10 Essential (primary) hypertension: Secondary | ICD-10-CM | POA: Diagnosis not present

## 2019-03-05 DIAGNOSIS — Z471 Aftercare following joint replacement surgery: Secondary | ICD-10-CM | POA: Diagnosis not present

## 2019-03-05 DIAGNOSIS — Z96652 Presence of left artificial knee joint: Secondary | ICD-10-CM | POA: Diagnosis not present

## 2019-03-09 ENCOUNTER — Encounter: Payer: Self-pay | Admitting: Rheumatology

## 2019-03-09 ENCOUNTER — Ambulatory Visit (INDEPENDENT_AMBULATORY_CARE_PROVIDER_SITE_OTHER): Payer: Medicare Other | Admitting: Rheumatology

## 2019-03-09 ENCOUNTER — Other Ambulatory Visit: Payer: Self-pay

## 2019-03-09 VITALS — BP 166/78 | HR 72 | Resp 12 | Ht 62.0 in | Wt 178.2 lb

## 2019-03-09 DIAGNOSIS — Z8673 Personal history of transient ischemic attack (TIA), and cerebral infarction without residual deficits: Secondary | ICD-10-CM | POA: Diagnosis not present

## 2019-03-09 DIAGNOSIS — M19041 Primary osteoarthritis, right hand: Secondary | ICD-10-CM

## 2019-03-09 DIAGNOSIS — M810 Age-related osteoporosis without current pathological fracture: Secondary | ICD-10-CM

## 2019-03-09 DIAGNOSIS — Z96652 Presence of left artificial knee joint: Secondary | ICD-10-CM

## 2019-03-09 DIAGNOSIS — Z8639 Personal history of other endocrine, nutritional and metabolic disease: Secondary | ICD-10-CM | POA: Diagnosis not present

## 2019-03-09 DIAGNOSIS — Z860100 Personal history of colon polyps, unspecified: Secondary | ICD-10-CM

## 2019-03-09 DIAGNOSIS — M1711 Unilateral primary osteoarthritis, right knee: Secondary | ICD-10-CM

## 2019-03-09 DIAGNOSIS — R5383 Other fatigue: Secondary | ICD-10-CM | POA: Diagnosis not present

## 2019-03-09 DIAGNOSIS — M19072 Primary osteoarthritis, left ankle and foot: Secondary | ICD-10-CM

## 2019-03-09 DIAGNOSIS — Z8659 Personal history of other mental and behavioral disorders: Secondary | ICD-10-CM

## 2019-03-09 DIAGNOSIS — M19042 Primary osteoarthritis, left hand: Secondary | ICD-10-CM | POA: Diagnosis not present

## 2019-03-09 DIAGNOSIS — Z79899 Other long term (current) drug therapy: Secondary | ICD-10-CM | POA: Diagnosis not present

## 2019-03-09 DIAGNOSIS — M5030 Other cervical disc degeneration, unspecified cervical region: Secondary | ICD-10-CM

## 2019-03-09 DIAGNOSIS — M19071 Primary osteoarthritis, right ankle and foot: Secondary | ICD-10-CM | POA: Diagnosis not present

## 2019-03-09 DIAGNOSIS — Z8679 Personal history of other diseases of the circulatory system: Secondary | ICD-10-CM | POA: Diagnosis not present

## 2019-03-09 DIAGNOSIS — E7800 Pure hypercholesterolemia, unspecified: Secondary | ICD-10-CM | POA: Diagnosis not present

## 2019-03-15 DIAGNOSIS — R2689 Other abnormalities of gait and mobility: Secondary | ICD-10-CM | POA: Diagnosis not present

## 2019-03-15 DIAGNOSIS — Z471 Aftercare following joint replacement surgery: Secondary | ICD-10-CM | POA: Diagnosis not present

## 2019-03-15 DIAGNOSIS — Z96652 Presence of left artificial knee joint: Secondary | ICD-10-CM | POA: Diagnosis not present

## 2019-03-15 DIAGNOSIS — M6281 Muscle weakness (generalized): Secondary | ICD-10-CM | POA: Diagnosis not present

## 2019-03-16 DIAGNOSIS — S61412A Laceration without foreign body of left hand, initial encounter: Secondary | ICD-10-CM | POA: Diagnosis not present

## 2019-03-16 DIAGNOSIS — S60922A Unspecified superficial injury of left hand, initial encounter: Secondary | ICD-10-CM | POA: Diagnosis not present

## 2019-03-17 DIAGNOSIS — R2689 Other abnormalities of gait and mobility: Secondary | ICD-10-CM | POA: Diagnosis not present

## 2019-03-17 DIAGNOSIS — M6281 Muscle weakness (generalized): Secondary | ICD-10-CM | POA: Diagnosis not present

## 2019-03-17 DIAGNOSIS — Z96652 Presence of left artificial knee joint: Secondary | ICD-10-CM | POA: Diagnosis not present

## 2019-03-17 DIAGNOSIS — Z471 Aftercare following joint replacement surgery: Secondary | ICD-10-CM | POA: Diagnosis not present

## 2019-03-19 DIAGNOSIS — E7800 Pure hypercholesterolemia, unspecified: Secondary | ICD-10-CM | POA: Diagnosis not present

## 2019-03-19 DIAGNOSIS — F329 Major depressive disorder, single episode, unspecified: Secondary | ICD-10-CM | POA: Diagnosis not present

## 2019-03-19 DIAGNOSIS — Z299 Encounter for prophylactic measures, unspecified: Secondary | ICD-10-CM | POA: Diagnosis not present

## 2019-03-19 DIAGNOSIS — I10 Essential (primary) hypertension: Secondary | ICD-10-CM | POA: Diagnosis not present

## 2019-03-19 DIAGNOSIS — Z713 Dietary counseling and surveillance: Secondary | ICD-10-CM | POA: Diagnosis not present

## 2019-03-23 DIAGNOSIS — M6281 Muscle weakness (generalized): Secondary | ICD-10-CM | POA: Diagnosis not present

## 2019-03-23 DIAGNOSIS — R2689 Other abnormalities of gait and mobility: Secondary | ICD-10-CM | POA: Diagnosis not present

## 2019-03-23 DIAGNOSIS — Z96652 Presence of left artificial knee joint: Secondary | ICD-10-CM | POA: Diagnosis not present

## 2019-03-23 DIAGNOSIS — Z471 Aftercare following joint replacement surgery: Secondary | ICD-10-CM | POA: Diagnosis not present

## 2019-03-24 DIAGNOSIS — Z471 Aftercare following joint replacement surgery: Secondary | ICD-10-CM | POA: Diagnosis not present

## 2019-03-24 DIAGNOSIS — Z96652 Presence of left artificial knee joint: Secondary | ICD-10-CM | POA: Diagnosis not present

## 2019-03-24 DIAGNOSIS — M6281 Muscle weakness (generalized): Secondary | ICD-10-CM | POA: Diagnosis not present

## 2019-03-24 DIAGNOSIS — R2689 Other abnormalities of gait and mobility: Secondary | ICD-10-CM | POA: Diagnosis not present

## 2019-03-26 DIAGNOSIS — Z471 Aftercare following joint replacement surgery: Secondary | ICD-10-CM | POA: Diagnosis not present

## 2019-03-26 DIAGNOSIS — R2689 Other abnormalities of gait and mobility: Secondary | ICD-10-CM | POA: Diagnosis not present

## 2019-03-26 DIAGNOSIS — M6281 Muscle weakness (generalized): Secondary | ICD-10-CM | POA: Diagnosis not present

## 2019-03-26 DIAGNOSIS — Z96652 Presence of left artificial knee joint: Secondary | ICD-10-CM | POA: Diagnosis not present

## 2019-03-28 IMAGING — NM NM MYOCAR MULTI W/SPECT W/WALL MOTION & EF
2 series · 12 of 12 positions shown · non-contrast
Comparison: none

[Series 1: rest · 8.28mm/px · 6 of 64 frames shown]
[frame 6/64]
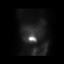
[frame 16/64]
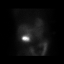
[frame 27/64]
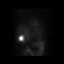
[frame 38/64]
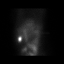
[frame 48/64]
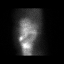
[frame 59/64]
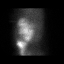

[Series 2: stress gated · 8.28mm/px · 6 of 64 frames shown]
[frame 6/64]
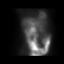
[frame 16/64]
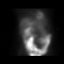
[frame 27/64]
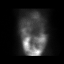
[frame 38/64]
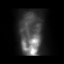
[frame 48/64]
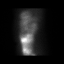
[frame 59/64]
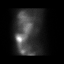

[12 of 12 positions shown; findings below may reference images not displayed]

Canned report from images found in remote index.

Refer to host system for actual result text.

## 2019-03-29 DIAGNOSIS — I10 Essential (primary) hypertension: Secondary | ICD-10-CM | POA: Diagnosis not present

## 2019-04-06 DIAGNOSIS — Z471 Aftercare following joint replacement surgery: Secondary | ICD-10-CM | POA: Diagnosis not present

## 2019-04-06 DIAGNOSIS — Z96652 Presence of left artificial knee joint: Secondary | ICD-10-CM | POA: Diagnosis not present

## 2019-04-06 DIAGNOSIS — R2689 Other abnormalities of gait and mobility: Secondary | ICD-10-CM | POA: Diagnosis not present

## 2019-04-06 DIAGNOSIS — M6281 Muscle weakness (generalized): Secondary | ICD-10-CM | POA: Diagnosis not present

## 2019-04-09 DIAGNOSIS — R2689 Other abnormalities of gait and mobility: Secondary | ICD-10-CM | POA: Diagnosis not present

## 2019-04-09 DIAGNOSIS — M6281 Muscle weakness (generalized): Secondary | ICD-10-CM | POA: Diagnosis not present

## 2019-04-09 DIAGNOSIS — Z471 Aftercare following joint replacement surgery: Secondary | ICD-10-CM | POA: Diagnosis not present

## 2019-04-09 DIAGNOSIS — Z96652 Presence of left artificial knee joint: Secondary | ICD-10-CM | POA: Diagnosis not present

## 2019-04-12 DIAGNOSIS — R2689 Other abnormalities of gait and mobility: Secondary | ICD-10-CM | POA: Diagnosis not present

## 2019-04-12 DIAGNOSIS — M6281 Muscle weakness (generalized): Secondary | ICD-10-CM | POA: Diagnosis not present

## 2019-04-12 DIAGNOSIS — Z471 Aftercare following joint replacement surgery: Secondary | ICD-10-CM | POA: Diagnosis not present

## 2019-04-12 DIAGNOSIS — Z96652 Presence of left artificial knee joint: Secondary | ICD-10-CM | POA: Diagnosis not present

## 2019-04-14 DIAGNOSIS — I471 Supraventricular tachycardia: Secondary | ICD-10-CM | POA: Diagnosis not present

## 2019-04-14 DIAGNOSIS — F332 Major depressive disorder, recurrent severe without psychotic features: Secondary | ICD-10-CM | POA: Diagnosis not present

## 2019-04-14 DIAGNOSIS — Z299 Encounter for prophylactic measures, unspecified: Secondary | ICD-10-CM | POA: Diagnosis not present

## 2019-04-14 DIAGNOSIS — Z6831 Body mass index (BMI) 31.0-31.9, adult: Secondary | ICD-10-CM | POA: Diagnosis not present

## 2019-04-14 DIAGNOSIS — I10 Essential (primary) hypertension: Secondary | ICD-10-CM | POA: Diagnosis not present

## 2019-04-15 DIAGNOSIS — M6281 Muscle weakness (generalized): Secondary | ICD-10-CM | POA: Diagnosis not present

## 2019-04-15 DIAGNOSIS — R2689 Other abnormalities of gait and mobility: Secondary | ICD-10-CM | POA: Diagnosis not present

## 2019-04-15 DIAGNOSIS — Z471 Aftercare following joint replacement surgery: Secondary | ICD-10-CM | POA: Diagnosis not present

## 2019-04-15 DIAGNOSIS — Z96652 Presence of left artificial knee joint: Secondary | ICD-10-CM | POA: Diagnosis not present

## 2019-04-16 DIAGNOSIS — Z96652 Presence of left artificial knee joint: Secondary | ICD-10-CM | POA: Diagnosis not present

## 2019-04-16 DIAGNOSIS — R2689 Other abnormalities of gait and mobility: Secondary | ICD-10-CM | POA: Diagnosis not present

## 2019-04-16 DIAGNOSIS — Z471 Aftercare following joint replacement surgery: Secondary | ICD-10-CM | POA: Diagnosis not present

## 2019-04-16 DIAGNOSIS — M6281 Muscle weakness (generalized): Secondary | ICD-10-CM | POA: Diagnosis not present

## 2019-04-26 DIAGNOSIS — Z96652 Presence of left artificial knee joint: Secondary | ICD-10-CM | POA: Diagnosis not present

## 2019-04-26 DIAGNOSIS — Z471 Aftercare following joint replacement surgery: Secondary | ICD-10-CM | POA: Diagnosis not present

## 2019-04-26 DIAGNOSIS — R2689 Other abnormalities of gait and mobility: Secondary | ICD-10-CM | POA: Diagnosis not present

## 2019-04-26 DIAGNOSIS — M6281 Muscle weakness (generalized): Secondary | ICD-10-CM | POA: Diagnosis not present

## 2019-04-28 DIAGNOSIS — Z96652 Presence of left artificial knee joint: Secondary | ICD-10-CM | POA: Diagnosis not present

## 2019-04-28 DIAGNOSIS — R2689 Other abnormalities of gait and mobility: Secondary | ICD-10-CM | POA: Diagnosis not present

## 2019-04-28 DIAGNOSIS — M6281 Muscle weakness (generalized): Secondary | ICD-10-CM | POA: Diagnosis not present

## 2019-04-28 DIAGNOSIS — Z471 Aftercare following joint replacement surgery: Secondary | ICD-10-CM | POA: Diagnosis not present

## 2019-04-30 DIAGNOSIS — Z471 Aftercare following joint replacement surgery: Secondary | ICD-10-CM | POA: Diagnosis not present

## 2019-04-30 DIAGNOSIS — R2689 Other abnormalities of gait and mobility: Secondary | ICD-10-CM | POA: Diagnosis not present

## 2019-04-30 DIAGNOSIS — M6281 Muscle weakness (generalized): Secondary | ICD-10-CM | POA: Diagnosis not present

## 2019-04-30 DIAGNOSIS — Z96652 Presence of left artificial knee joint: Secondary | ICD-10-CM | POA: Diagnosis not present

## 2019-05-03 DIAGNOSIS — Z96652 Presence of left artificial knee joint: Secondary | ICD-10-CM | POA: Diagnosis not present

## 2019-05-03 DIAGNOSIS — Z471 Aftercare following joint replacement surgery: Secondary | ICD-10-CM | POA: Diagnosis not present

## 2019-05-03 DIAGNOSIS — R2689 Other abnormalities of gait and mobility: Secondary | ICD-10-CM | POA: Diagnosis not present

## 2019-05-03 DIAGNOSIS — M6281 Muscle weakness (generalized): Secondary | ICD-10-CM | POA: Diagnosis not present

## 2019-05-05 DIAGNOSIS — Z471 Aftercare following joint replacement surgery: Secondary | ICD-10-CM | POA: Diagnosis not present

## 2019-05-05 DIAGNOSIS — R2689 Other abnormalities of gait and mobility: Secondary | ICD-10-CM | POA: Diagnosis not present

## 2019-05-05 DIAGNOSIS — M6281 Muscle weakness (generalized): Secondary | ICD-10-CM | POA: Diagnosis not present

## 2019-05-05 DIAGNOSIS — Z96652 Presence of left artificial knee joint: Secondary | ICD-10-CM | POA: Diagnosis not present

## 2019-05-07 DIAGNOSIS — Z471 Aftercare following joint replacement surgery: Secondary | ICD-10-CM | POA: Diagnosis not present

## 2019-05-07 DIAGNOSIS — Z96652 Presence of left artificial knee joint: Secondary | ICD-10-CM | POA: Diagnosis not present

## 2019-05-07 DIAGNOSIS — R2689 Other abnormalities of gait and mobility: Secondary | ICD-10-CM | POA: Diagnosis not present

## 2019-05-07 DIAGNOSIS — M6281 Muscle weakness (generalized): Secondary | ICD-10-CM | POA: Diagnosis not present

## 2019-05-10 DIAGNOSIS — I10 Essential (primary) hypertension: Secondary | ICD-10-CM | POA: Diagnosis not present

## 2019-05-11 DIAGNOSIS — Z96652 Presence of left artificial knee joint: Secondary | ICD-10-CM | POA: Diagnosis not present

## 2019-05-11 DIAGNOSIS — M6281 Muscle weakness (generalized): Secondary | ICD-10-CM | POA: Diagnosis not present

## 2019-05-11 DIAGNOSIS — R2689 Other abnormalities of gait and mobility: Secondary | ICD-10-CM | POA: Diagnosis not present

## 2019-05-11 DIAGNOSIS — Z23 Encounter for immunization: Secondary | ICD-10-CM | POA: Diagnosis not present

## 2019-05-11 DIAGNOSIS — Z471 Aftercare following joint replacement surgery: Secondary | ICD-10-CM | POA: Diagnosis not present

## 2019-05-14 DIAGNOSIS — Z96652 Presence of left artificial knee joint: Secondary | ICD-10-CM | POA: Diagnosis not present

## 2019-05-14 DIAGNOSIS — Z471 Aftercare following joint replacement surgery: Secondary | ICD-10-CM | POA: Diagnosis not present

## 2019-05-14 DIAGNOSIS — R2689 Other abnormalities of gait and mobility: Secondary | ICD-10-CM | POA: Diagnosis not present

## 2019-05-14 DIAGNOSIS — M6281 Muscle weakness (generalized): Secondary | ICD-10-CM | POA: Diagnosis not present

## 2019-05-18 DIAGNOSIS — Z96652 Presence of left artificial knee joint: Secondary | ICD-10-CM | POA: Diagnosis not present

## 2019-05-18 DIAGNOSIS — Z471 Aftercare following joint replacement surgery: Secondary | ICD-10-CM | POA: Diagnosis not present

## 2019-05-18 DIAGNOSIS — M6281 Muscle weakness (generalized): Secondary | ICD-10-CM | POA: Diagnosis not present

## 2019-05-18 DIAGNOSIS — R2689 Other abnormalities of gait and mobility: Secondary | ICD-10-CM | POA: Diagnosis not present

## 2019-05-31 DIAGNOSIS — I10 Essential (primary) hypertension: Secondary | ICD-10-CM | POA: Diagnosis not present

## 2019-06-02 DIAGNOSIS — I10 Essential (primary) hypertension: Secondary | ICD-10-CM | POA: Diagnosis not present

## 2019-06-02 DIAGNOSIS — S6992XA Unspecified injury of left wrist, hand and finger(s), initial encounter: Secondary | ICD-10-CM | POA: Diagnosis not present

## 2019-06-02 DIAGNOSIS — Z6829 Body mass index (BMI) 29.0-29.9, adult: Secondary | ICD-10-CM | POA: Diagnosis not present

## 2019-06-02 DIAGNOSIS — Y92009 Unspecified place in unspecified non-institutional (private) residence as the place of occurrence of the external cause: Secondary | ICD-10-CM | POA: Diagnosis not present

## 2019-06-02 DIAGNOSIS — M25532 Pain in left wrist: Secondary | ICD-10-CM | POA: Diagnosis not present

## 2019-06-02 DIAGNOSIS — W19XXXA Unspecified fall, initial encounter: Secondary | ICD-10-CM | POA: Diagnosis not present

## 2019-06-02 DIAGNOSIS — Z299 Encounter for prophylactic measures, unspecified: Secondary | ICD-10-CM | POA: Diagnosis not present

## 2019-06-02 DIAGNOSIS — S20219A Contusion of unspecified front wall of thorax, initial encounter: Secondary | ICD-10-CM | POA: Diagnosis not present

## 2019-06-02 DIAGNOSIS — S299XXA Unspecified injury of thorax, initial encounter: Secondary | ICD-10-CM | POA: Diagnosis not present

## 2019-06-02 DIAGNOSIS — S2020XA Contusion of thorax, unspecified, initial encounter: Secondary | ICD-10-CM | POA: Diagnosis not present

## 2019-06-25 DIAGNOSIS — Z299 Encounter for prophylactic measures, unspecified: Secondary | ICD-10-CM | POA: Diagnosis not present

## 2019-06-25 DIAGNOSIS — I10 Essential (primary) hypertension: Secondary | ICD-10-CM | POA: Diagnosis not present

## 2019-06-25 DIAGNOSIS — F332 Major depressive disorder, recurrent severe without psychotic features: Secondary | ICD-10-CM | POA: Diagnosis not present

## 2019-06-25 DIAGNOSIS — R0789 Other chest pain: Secondary | ICD-10-CM | POA: Diagnosis not present

## 2019-06-25 DIAGNOSIS — Z6830 Body mass index (BMI) 30.0-30.9, adult: Secondary | ICD-10-CM | POA: Diagnosis not present

## 2019-07-05 DIAGNOSIS — I10 Essential (primary) hypertension: Secondary | ICD-10-CM | POA: Diagnosis not present

## 2019-07-08 DIAGNOSIS — F332 Major depressive disorder, recurrent severe without psychotic features: Secondary | ICD-10-CM | POA: Diagnosis not present

## 2019-07-08 DIAGNOSIS — Z6830 Body mass index (BMI) 30.0-30.9, adult: Secondary | ICD-10-CM | POA: Diagnosis not present

## 2019-07-08 DIAGNOSIS — I10 Essential (primary) hypertension: Secondary | ICD-10-CM | POA: Diagnosis not present

## 2019-07-08 DIAGNOSIS — Z299 Encounter for prophylactic measures, unspecified: Secondary | ICD-10-CM | POA: Diagnosis not present

## 2019-07-08 DIAGNOSIS — I471 Supraventricular tachycardia: Secondary | ICD-10-CM | POA: Diagnosis not present

## 2019-07-16 ENCOUNTER — Encounter: Payer: Self-pay | Admitting: *Deleted

## 2019-07-16 ENCOUNTER — Encounter: Payer: Self-pay | Admitting: Cardiology

## 2019-07-16 ENCOUNTER — Telehealth (INDEPENDENT_AMBULATORY_CARE_PROVIDER_SITE_OTHER): Payer: Medicare Other | Admitting: Cardiology

## 2019-07-16 VITALS — Ht 62.0 in | Wt 157.0 lb

## 2019-07-16 DIAGNOSIS — I10 Essential (primary) hypertension: Secondary | ICD-10-CM

## 2019-07-16 DIAGNOSIS — I447 Left bundle-branch block, unspecified: Secondary | ICD-10-CM | POA: Diagnosis not present

## 2019-07-16 DIAGNOSIS — I38 Endocarditis, valve unspecified: Secondary | ICD-10-CM

## 2019-07-16 NOTE — Progress Notes (Signed)
Virtual Visit via Telephone Note   This visit type was conducted due to national recommendations for restrictions regarding the COVID-19 Pandemic (e.g. social distancing) in an effort to limit this patient's exposure and mitigate transmission in our community.  Due to her co-morbid illnesses, this patient is at least at moderate risk for complications without adequate follow up.  This format is felt to be most appropriate for this patient at this time.  The patient did not have access to video technology/had technical difficulties with video requiring transitioning to audio format only (telephone).  All issues noted in this document were discussed and addressed.  No physical exam could be performed with this format.  Please refer to the patient's chart for her  consent to telehealth for [facility]   Date:  07/16/2019   ID:  Jane [name], DOB [birth year], MRN [id number]  Patient Location: Home Provider Location: Office  PCP:  Ignatius Specking, MD  Cardiologist:  Dina Rich, MD  Electrophysiologist:  None   Evaluation Performed:  Follow-Up Visit  Chief Complaint:  Follow up visit  History of Present Illness:    Jane [name] is a 78 y.o. female seen today for follow up of the following medical problems.    1.ChronicLBBB - reports history 20 years of LBBB, she reports iniitally LBBB was rate related. Diagnosed in 1996, at that time rates 160-170 on treadmill stress she developed LBBB - heart cath 2005 was normal  - 11/2016 echo LVEF 55-60%.  - 12/2016 nuclear stress test no ischemia  - no recent chest pain, SOB or DOE    2. Valvular heart disease - 11/2016 echo mild to mod MR, moderate TR - no recent symptoms  No recent SOB or DOE. No edema   3. HTN - does not check regularly at home.  - compliant with meds  - issues with constipation on norvasc, was stopped and started on chlorthalidone 12.5mg  daily but did not start due to concerns about side effects,  reported body very sensitive to medications  - bp's followed by pcp - pcp started chlorthalidone 25mg  daily.  - bps at home 120s-170s SBP at home. She does not sit for 5 minutes prior to taking.     The patient does not have symptoms concerning for COVID-19 infection (fever, chills, cough, or new shortness of breath).    Past Medical History:  Diagnosis Date   Arthritis    Depression    H/O: rheumatic fever    as child   Heart murmur    Hx of colonic polyps    Hypertension    Past Surgical History:  Procedure Laterality Date   CARDIAC CATHETERIZATION     COLONOSCOPY  06/03/2012   Procedure: COLONOSCOPY;  Surgeon: Malissa Hippo, MD;  [facility];  Service: Endoscopy;  Laterality: N/A;  1200   COLONOSCOPY N/A 11/26/2017   Procedure: COLONOSCOPY;  Surgeon: Malissa Hippo, MD;  [facility];  Service: Endoscopy;  Laterality: N/A;  830   FACIAL COSMETIC SURGERY     RHINOPLASTY     TOE SURGERY     TONSILLECTOMY     TOTAL KNEE ARTHROPLASTY Left 12/16/2016   Procedure: LEFT TOTAL KNEE ARTHROPLASTY;  Surgeon: Ollen Gross, MD;  Location: [facility];  Service: Orthopedics;  Laterality: Left;  requests   TUBAL LIGATION       Current Meds  Medication Sig   acetaminophen (TYLENOL) 650 MG CR tablet Take 650 mg by mouth  daily.   aspirin EC 81 MG tablet Take 81 mg by mouth at bedtime.   Calcium Carbonate-Vitamin D (CALTRATE 600+D PO) Take 1 tablet by mouth 2 (two) times daily.   chlorthalidone (HYGROTON) 25 MG tablet Take 25 mg by mouth daily.   Cholecalciferol (VITAMIN D3) 2000 units TABS Take 2,000 Units by mouth daily.   citalopram (CELEXA) 40 MG tablet Take 40 mg by mouth at bedtime.    diclofenac sodium (VOLTAREN) 1 % GEL Apply to affected area 3-4 times daily PRN (Patient taking differently: Apply 1 application topically 4 (four) times daily as needed (for pain). )   lisinopril (PRINIVIL,ZESTRIL) 20 MG tablet Take 20 mg by mouth 2  (two) times daily.   magnesium oxide (MAG-OX) 400 MG tablet Take 400 mg by mouth daily.   meclizine (ANTIVERT) 25 MG tablet Take 12.5-25 mg by mouth daily as needed for dizziness.    Multiple Vitamins-Minerals (CENTRUM SILVER PO) Take 1 tablet by mouth daily.   Omega-3 Fatty Acids (FISH OIL PO) Take 2 capsules by mouth daily.   simvastatin (ZOCOR) 20 MG tablet Take 20 mg by mouth at bedtime.   Tetrahydrozoline HCl (MURINE FOR RED EYES OP) Place 1 drop into both eyes daily as needed (for red/dry eyes).   [DISCONTINUED] gabapentin (NEURONTIN) 300 MG capsule Take 300 mg by mouth. 3 per week, weaning off of this medication.     Allergies:   Ciprofloxacin hcl, Clindamycin/lincomycin, Flagyl [metronidazole], Gabapentin, Lodine [etodolac], Other, and Penicillins   Social History   Tobacco Use   Smoking status: Never Smoker   Smokeless tobacco: Never Used  Substance Use Topics   Alcohol use: Yes    Comment: rare   Drug use: No     Family Hx: The patient's family history includes COPD in her sister; Colon cancer in her mother; Diabetes in her sister; Healthy in her daughter and son; Heart Problems in her father; Hypertension in her sister and sister; Thyroid disease in her sister.  ROS:   Please see the history of present illness.     All other systems reviewed and are negative.   Prior CV studies:   The following studies were reviewed today:  11/2016 echo [place] LVEF 55-60%,   12/2016 Nuclear stress  The study is normal.  This is a low risk study.  The left ventricular ejection fraction is normal (55-65%).  There was no ST segment deviation noted during stress.  Normal resting and stress perfusion. No ischemia or infarction EF 54%   Labs/Other Tests and Data Reviewed:    EKG:  No ECG reviewed.  Recent Labs: No results found for requested labs within last 8760 hours.   Recent Lipid Panel No results found for: CHOL, TRIG, HDL, CHOLHDL, LDLCALC,  LDLDIRECT  Wt Readings from Last 3 Encounters:  07/16/19 157 lb (71.2 kg)  03/09/19 178 lb 3.2 oz (80.8 kg)  07/13/18 179 lb 12.8 oz (81.6 kg)     Objective:    Vital Signs:  Ht 5\' 2"  (1.575 m)    Wt 157 lb (71.2 kg)    BMI 28.72 kg/m    Normal affect. Normal speech pattern and tone. Comfortable, no apparent distress. No audible signs of SOB or wheezing.   ASSESSMENT & PLAN:    1.ChronicLBBB -no evidence of underlying significant heart disease by extensive workup  - no symptoms, continue to monitor.   2. Valvular heart disease - mild to mod MR, mod TR by last echo - we will continue  to monitor, likely repeat echo next year   3. HTN - discussed proper technique for measuring bp's at home, she has some variability in her pressures that may be technique related - follow bp's going forward, goal would be <130/80    COVID-19 Education: The signs and symptoms of COVID-19 were discussed with the patient and how to seek care for testing (follow up with PCP or arrange E-visit).  The importance of social distancing was discussed today.  Time:   Today, I have spent 18 minutes with the patient with telehealth technology discussing the above problems.     Medication Adjustments/Labs and Tests Ordered: Current medicines are reviewed at length with the patient today.  Concerns regarding medicines are outlined above.   Tests Ordered: No orders of the defined types were placed in this encounter.   Medication Changes: No orders of the defined types were placed in this encounter.   Follow Up:  In Person in 1 year(s)  Signed, Dina Rich, MD  07/16/2019 12:50 PM    [facility]

## 2019-07-16 NOTE — Patient Instructions (Signed)

## 2019-07-20 DIAGNOSIS — Z1231 Encounter for screening mammogram for malignant neoplasm of breast: Secondary | ICD-10-CM | POA: Diagnosis not present

## 2019-07-21 DIAGNOSIS — Z6830 Body mass index (BMI) 30.0-30.9, adult: Secondary | ICD-10-CM | POA: Diagnosis not present

## 2019-07-21 DIAGNOSIS — I471 Supraventricular tachycardia: Secondary | ICD-10-CM | POA: Diagnosis not present

## 2019-07-21 DIAGNOSIS — I10 Essential (primary) hypertension: Secondary | ICD-10-CM | POA: Diagnosis not present

## 2019-07-21 DIAGNOSIS — Z299 Encounter for prophylactic measures, unspecified: Secondary | ICD-10-CM | POA: Diagnosis not present

## 2019-07-21 DIAGNOSIS — F332 Major depressive disorder, recurrent severe without psychotic features: Secondary | ICD-10-CM | POA: Diagnosis not present

## 2019-09-09 DIAGNOSIS — I10 Essential (primary) hypertension: Secondary | ICD-10-CM | POA: Diagnosis not present

## 2019-09-21 DIAGNOSIS — Z23 Encounter for immunization: Secondary | ICD-10-CM | POA: Diagnosis not present

## 2019-10-05 DIAGNOSIS — I10 Essential (primary) hypertension: Secondary | ICD-10-CM | POA: Diagnosis not present

## 2019-11-01 DIAGNOSIS — Z23 Encounter for immunization: Secondary | ICD-10-CM | POA: Diagnosis not present

## 2019-11-07 DIAGNOSIS — I10 Essential (primary) hypertension: Secondary | ICD-10-CM | POA: Diagnosis not present

## 2019-11-15 DIAGNOSIS — F322 Major depressive disorder, single episode, severe without psychotic features: Secondary | ICD-10-CM | POA: Diagnosis not present

## 2019-11-15 DIAGNOSIS — I471 Supraventricular tachycardia: Secondary | ICD-10-CM | POA: Diagnosis not present

## 2019-11-15 DIAGNOSIS — Z299 Encounter for prophylactic measures, unspecified: Secondary | ICD-10-CM | POA: Diagnosis not present

## 2019-11-15 DIAGNOSIS — I10 Essential (primary) hypertension: Secondary | ICD-10-CM | POA: Diagnosis not present

## 2019-12-07 DIAGNOSIS — I10 Essential (primary) hypertension: Secondary | ICD-10-CM | POA: Diagnosis not present

## 2019-12-08 DIAGNOSIS — I10 Essential (primary) hypertension: Secondary | ICD-10-CM | POA: Diagnosis not present

## 2019-12-08 DIAGNOSIS — I471 Supraventricular tachycardia: Secondary | ICD-10-CM | POA: Diagnosis not present

## 2019-12-08 DIAGNOSIS — M79604 Pain in right leg: Secondary | ICD-10-CM | POA: Diagnosis not present

## 2019-12-08 DIAGNOSIS — Z299 Encounter for prophylactic measures, unspecified: Secondary | ICD-10-CM | POA: Diagnosis not present

## 2019-12-14 DIAGNOSIS — H35361 Drusen (degenerative) of macula, right eye: Secondary | ICD-10-CM | POA: Diagnosis not present

## 2019-12-14 DIAGNOSIS — H524 Presbyopia: Secondary | ICD-10-CM | POA: Diagnosis not present

## 2020-01-07 DIAGNOSIS — M7121 Synovial cyst of popliteal space [Baker], right knee: Secondary | ICD-10-CM | POA: Diagnosis not present

## 2020-01-07 DIAGNOSIS — I10 Essential (primary) hypertension: Secondary | ICD-10-CM | POA: Diagnosis not present

## 2020-01-07 DIAGNOSIS — M25561 Pain in right knee: Secondary | ICD-10-CM | POA: Diagnosis not present

## 2020-01-13 DIAGNOSIS — M7051 Other bursitis of knee, right knee: Secondary | ICD-10-CM | POA: Diagnosis not present

## 2020-01-13 DIAGNOSIS — M1711 Unilateral primary osteoarthritis, right knee: Secondary | ICD-10-CM | POA: Diagnosis not present

## 2020-01-25 DIAGNOSIS — M1711 Unilateral primary osteoarthritis, right knee: Secondary | ICD-10-CM | POA: Diagnosis not present

## 2020-01-25 DIAGNOSIS — M7051 Other bursitis of knee, right knee: Secondary | ICD-10-CM | POA: Diagnosis not present

## 2020-01-25 DIAGNOSIS — M6281 Muscle weakness (generalized): Secondary | ICD-10-CM | POA: Diagnosis not present

## 2020-01-31 DIAGNOSIS — M6281 Muscle weakness (generalized): Secondary | ICD-10-CM | POA: Diagnosis not present

## 2020-01-31 DIAGNOSIS — M7051 Other bursitis of knee, right knee: Secondary | ICD-10-CM | POA: Diagnosis not present

## 2020-01-31 DIAGNOSIS — M1711 Unilateral primary osteoarthritis, right knee: Secondary | ICD-10-CM | POA: Diagnosis not present

## 2020-02-02 DIAGNOSIS — M1711 Unilateral primary osteoarthritis, right knee: Secondary | ICD-10-CM | POA: Diagnosis not present

## 2020-02-02 DIAGNOSIS — M7051 Other bursitis of knee, right knee: Secondary | ICD-10-CM | POA: Diagnosis not present

## 2020-02-02 DIAGNOSIS — M6281 Muscle weakness (generalized): Secondary | ICD-10-CM | POA: Diagnosis not present

## 2020-02-06 DIAGNOSIS — I10 Essential (primary) hypertension: Secondary | ICD-10-CM | POA: Diagnosis not present

## 2020-02-08 DIAGNOSIS — M6281 Muscle weakness (generalized): Secondary | ICD-10-CM | POA: Diagnosis not present

## 2020-02-08 DIAGNOSIS — M7051 Other bursitis of knee, right knee: Secondary | ICD-10-CM | POA: Diagnosis not present

## 2020-02-08 DIAGNOSIS — M1711 Unilateral primary osteoarthritis, right knee: Secondary | ICD-10-CM | POA: Diagnosis not present

## 2020-02-10 DIAGNOSIS — M7051 Other bursitis of knee, right knee: Secondary | ICD-10-CM | POA: Diagnosis not present

## 2020-02-10 DIAGNOSIS — M6281 Muscle weakness (generalized): Secondary | ICD-10-CM | POA: Diagnosis not present

## 2020-02-10 DIAGNOSIS — M1711 Unilateral primary osteoarthritis, right knee: Secondary | ICD-10-CM | POA: Diagnosis not present

## 2020-02-15 DIAGNOSIS — M1711 Unilateral primary osteoarthritis, right knee: Secondary | ICD-10-CM | POA: Diagnosis not present

## 2020-02-15 DIAGNOSIS — M7051 Other bursitis of knee, right knee: Secondary | ICD-10-CM | POA: Diagnosis not present

## 2020-02-15 DIAGNOSIS — M6281 Muscle weakness (generalized): Secondary | ICD-10-CM | POA: Diagnosis not present

## 2020-02-17 DIAGNOSIS — M7051 Other bursitis of knee, right knee: Secondary | ICD-10-CM | POA: Diagnosis not present

## 2020-02-17 DIAGNOSIS — M6281 Muscle weakness (generalized): Secondary | ICD-10-CM | POA: Diagnosis not present

## 2020-02-17 DIAGNOSIS — M1711 Unilateral primary osteoarthritis, right knee: Secondary | ICD-10-CM | POA: Diagnosis not present

## 2020-02-23 NOTE — Progress Notes (Addendum)
Office Visit Note  Patient: Jane [name]             Date of Birth: 1940/10/24           MRN: [id number]             PCP: Ignatius Specking, MD Referring: Ignatius Specking, MD Visit Date: 03/07/2020 Occupation: @GUAROCC @  Subjective:  Right knee joint pain  History of Present Illness: Jane [name] is a 79 y.o. female with history of osteoarthritis and osteoporosis.  She has intermittent generalized arthralgias, which are tolerable with the use of voltaren gel, CBD cream, and OTC products for pain relief.  She continues to have chronic pain in the left knee replacement.   She was referred to Dr. Regino Schultze for a nerve block, which did not provide any pain relief. She has had several falls due to her left knee buckling. She states about 3 months ago she started to experience severe pain in the right knee joint, which resolved on its own.  She states the pain recurred 2-3 weeks ago.  She was evaluated by Dr. Linna Caprice at [facility] who obtained x-rays.  She was referred to physical therapy.   Activities of Daily Living:  Patient reports morning stiffness for 10 minutes.   Patient Denies nocturnal pain.  Difficulty dressing/grooming: Reports Difficulty climbing stairs: Reports Difficulty getting out of chair: Denies Difficulty using hands for taps, buttons, cutlery, and/or writing: Reports  Review of Systems  Constitutional: Positive for fatigue.  HENT: Negative for mouth sores, mouth dryness and nose dryness.   Eyes: Negative for pain, visual disturbance and dryness.  Respiratory: Negative for cough, hemoptysis and difficulty breathing.   Cardiovascular: Positive for swelling in legs/feet. Negative for chest pain, palpitations and hypertension.  Gastrointestinal: Negative for blood in stool, constipation and diarrhea.  Endocrine: Negative for excessive thirst and increased urination.  Genitourinary: Negative for difficulty urinating and painful urination.  Musculoskeletal: Positive for  arthralgias, gait problem, joint pain, joint swelling, muscle weakness and morning stiffness. Negative for myalgias, muscle tenderness and myalgias.  Skin: Negative for color change, pallor, rash, hair loss, nodules/bumps, skin tightness, ulcers and sensitivity to sunlight.  Allergic/Immunologic: Negative for susceptible to infections.  Neurological: Negative for dizziness, numbness and headaches.  Hematological: Negative for swollen glands.  Psychiatric/Behavioral: Negative for depressed mood and sleep disturbance. The patient is not nervous/anxious.     PMFS History:  Patient Active Problem List   Diagnosis Date Noted  . Primary osteoarthritis of both hands 03/09/2019  . Primary osteoarthritis of both feet 03/09/2019  . H/O total knee replacement, left 03/09/2019  . Age-related osteoporosis without current pathological fracture 03/09/2019  . DDD (degenerative disc disease), cervical 03/09/2019  . Hx of colonic polyps 06/13/2017  . Family history of colon cancer 06/13/2017  . OA (osteoarthritis) of knee 12/16/2016    Past Medical History:  Diagnosis Date  . Arthritis   . Depression   . H/O: rheumatic fever    as child  . Heart murmur   . Hx of colonic polyps   . Hypertension     Family History  Problem Relation Age of Onset  . Colon cancer Mother   . Heart Problems Father   . COPD Sister   . Hypertension Sister   . Diabetes Sister   . Thyroid disease Sister   . Hypertension Sister   . Healthy Son   . Healthy Daughter    Past Surgical History:  Procedure Laterality Date  .  CARDIAC CATHETERIZATION    . COLONOSCOPY  06/03/2012   Procedure: COLONOSCOPY;  Surgeon: Malissa Hippo, MD;  [facility];  Service: Endoscopy;  Laterality: N/A;  1200  . COLONOSCOPY N/A 11/26/2017   Procedure: COLONOSCOPY;  Surgeon: Malissa Hippo, MD;  [facility];  Service: Endoscopy;  Laterality: N/A;  830  . FACIAL COSMETIC SURGERY    . KNEE ARTHROPLASTY    .  RHINOPLASTY    . TOE SURGERY    . TONSILLECTOMY    . TOTAL KNEE ARTHROPLASTY Left 12/16/2016   Procedure: LEFT TOTAL KNEE ARTHROPLASTY;  Surgeon: Ollen Gross, MD;  Location: [facility];  Service: Orthopedics;  Laterality: Left;  requests  . TUBAL LIGATION     Social History   Social History Narrative  . Not on file    There is no immunization history on file for this patient.   Objective: Vital Signs: BP 138/69 (BP Location: Right Arm, Patient Position: Sitting, Cuff Size: Normal)   Pulse 68   Resp 16   Ht 5' 1.75" (1.568 m)   Wt 165 lb 6.4 oz (75 kg)   BMI 30.50 kg/m    Physical Exam Vitals and nursing note reviewed.  Constitutional:      Appearance: She is well-developed.  HENT:     Head: Normocephalic and atraumatic.  Eyes:     Conjunctiva/sclera: Conjunctivae normal.  Pulmonary:     Effort: Pulmonary effort is normal.  Abdominal:     General: Bowel sounds are normal.     Palpations: Abdomen is soft.  Musculoskeletal:     Cervical back: Normal range of motion.  Lymphadenopathy:     Cervical: No cervical adenopathy.  Skin:    General: Skin is warm and dry.     Capillary Refill: Capillary refill takes less than 2 seconds.  Neurological:     Mental Status: She is alert and oriented to person, place, and time.  Psychiatric:        Behavior: Behavior normal.      Musculoskeletal Exam: C-spine, thoracic spine, and lumbar spine good ROM.  Shoulder joints, elbow joints, wrist joints, MCPs, PIPs, and DIPs good ROM with no synovitis.  PIP and DIP thickening consistent with osteoarthritis of both hands.  Complete fist formation bilaterally.  Hip joints good ROM.  Left knee painful and limited extension.  Warmth of the left knee replacement.  Right knee has good ROM.  No warmth or effusion of knee joints.  Ankle joints good ROM with no tenderness or inflammation.   CDAI Exam: CDAI Score: -- Patient Global: --; Provider Global: -- Swollen: --; Tender: -- Joint Exam  03/07/2020   No joint exam has been documented for this visit   There is currently no information documented on the homunculus. Go to the Rheumatology activity and complete the homunculus joint exam.  Investigation: No additional findings.  Imaging: No results found.  Recent Labs: Lab Results  Component Value Date   WBC 10.3 10/02/2017   HGB 12.6 11/26/2017   PLT 224 10/02/2017   NA 131 (L) 10/02/2017   K 3.6 10/02/2017   CL 94 (L) 10/02/2017   CO2 26 10/02/2017   GLUCOSE 113 (H) 10/02/2017   BUN 21 (H) 10/02/2017   CREATININE 0.90 10/02/2017   BILITOT 0.6 12/11/2016   ALKPHOS 58 12/11/2016   AST 22 12/11/2016   ALT 22 12/11/2016   PROT 7.2 12/11/2016   ALBUMIN 4.5 12/11/2016   CALCIUM 9.1 10/02/2017  GFRAA >60 10/02/2017    Speciality Comments: No specialty comments available.  Procedures:  No procedures performed Allergies: Ciprofloxacin hcl, Clindamycin/lincomycin, Flagyl [metronidazole], Gabapentin, Lodine [etodolac], Other, and Penicillins   Assessment / Plan:     Visit Diagnoses: Primary osteoarthritis of both hands-patient continues to have pain and discomfort in her bilateral hands.  No synovitis was noted.  Primary osteoarthritis of right knee-she had increased pain in her right knee joint recently which eventually resolved.  She still have some discomfort in her right knee joint.  We do not have any x-rays available.  She has seen orthopedics in the past.  I have advised her to bring x-rays at the next visit.  She is interested in getting Visco supplement injections.  I was in agreement.  H/O total knee replacement, left - 12/16/2016 by Dr. Despina Hick.  She continues to have ongoing pain and discomfort in her left knee.  She is planning to get an opinion from [place].  She has tried nerve block which was not effective.  Patient was quite tearful during the conversation today.  Primary osteoarthritis of both feet - calcaneal spurs.  Proper fitting shoes were  discussed.  DDD (degenerative disc disease), cervical-she has some discomfort in her cervical spine.  Age-related osteoporosis without current pathological fracture - Patient reports her most recent bone density showed T score of -2.5.  She will have repeat DEXA by her PCP before making any further decisions.  Other medical problems are listed as follows:  History of TIA (transient ischemic attack)  History of hypertension  History of hypercholesterolemia  History of depression  History of colon polyps  Orders: No orders of the defined types were placed in this encounter.  No orders of the defined types were placed in this encounter.     Follow-Up Instructions: Return in about 1 year (around 03/07/2021) for Osteoarthritis, Osteoporosis.   Pollyann Savoy, MD    Note - This record has been created using Animal nutritionist.  Chart creation errors have been sought, but may not always  have been located. Such creation errors do not reflect on  the standard of medical care.

## 2020-03-07 ENCOUNTER — Ambulatory Visit (INDEPENDENT_AMBULATORY_CARE_PROVIDER_SITE_OTHER): Payer: Medicare Other | Admitting: Rheumatology

## 2020-03-07 ENCOUNTER — Other Ambulatory Visit: Payer: Self-pay

## 2020-03-07 ENCOUNTER — Encounter: Payer: Self-pay | Admitting: Rheumatology

## 2020-03-07 VITALS — BP 138/69 | HR 68 | Resp 16 | Ht 61.75 in | Wt 165.4 lb

## 2020-03-07 DIAGNOSIS — M19042 Primary osteoarthritis, left hand: Secondary | ICD-10-CM

## 2020-03-07 DIAGNOSIS — Z86010 Personal history of colonic polyps: Secondary | ICD-10-CM

## 2020-03-07 DIAGNOSIS — Z8659 Personal history of other mental and behavioral disorders: Secondary | ICD-10-CM

## 2020-03-07 DIAGNOSIS — Z8673 Personal history of transient ischemic attack (TIA), and cerebral infarction without residual deficits: Secondary | ICD-10-CM

## 2020-03-07 DIAGNOSIS — M810 Age-related osteoporosis without current pathological fracture: Secondary | ICD-10-CM

## 2020-03-07 DIAGNOSIS — M1711 Unilateral primary osteoarthritis, right knee: Secondary | ICD-10-CM

## 2020-03-07 DIAGNOSIS — I10 Essential (primary) hypertension: Secondary | ICD-10-CM | POA: Diagnosis not present

## 2020-03-07 DIAGNOSIS — Z8679 Personal history of other diseases of the circulatory system: Secondary | ICD-10-CM

## 2020-03-07 DIAGNOSIS — M5030 Other cervical disc degeneration, unspecified cervical region: Secondary | ICD-10-CM

## 2020-03-07 DIAGNOSIS — M19072 Primary osteoarthritis, left ankle and foot: Secondary | ICD-10-CM

## 2020-03-07 DIAGNOSIS — M19071 Primary osteoarthritis, right ankle and foot: Secondary | ICD-10-CM | POA: Diagnosis not present

## 2020-03-07 DIAGNOSIS — Z96652 Presence of left artificial knee joint: Secondary | ICD-10-CM | POA: Diagnosis not present

## 2020-03-07 DIAGNOSIS — M19041 Primary osteoarthritis, right hand: Secondary | ICD-10-CM

## 2020-03-07 DIAGNOSIS — Z8639 Personal history of other endocrine, nutritional and metabolic disease: Secondary | ICD-10-CM

## 2020-03-10 ENCOUNTER — Encounter: Payer: Self-pay | Admitting: Cardiology

## 2020-03-21 IMAGING — NM NM BONE 3 PHASE
10 series · 20 of 20 positions shown · non-contrast
Comparison: None

CLINICAL DATA: Pain in knee since [DATE]. Evaluate for left total
knee arthroplasty device loosening. Left total knee arthroplasty on
12/16/2016.

EXAM:
NUCLEAR MEDICINE 3-PHASE BONE SCAN
TECHNIQUE: Radionuclide angiographic images, immediate static blood pool
images, and 3-hour delayed static images were obtained of the knees
after intravenous injection of radiopharmaceutical.
RADIOPHARMACEUTICALS:  20.9 mCi Tc-SSm MDP IV

[Series 1: flow · 2.07mm/px · 6 of 48 frames shown (1 of 2)]
[frame 5/48  full-range]
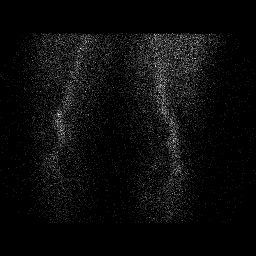
[frame 13/48  full-range]
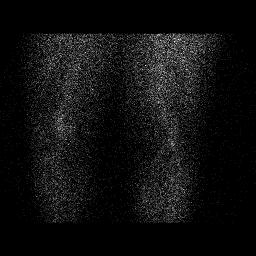
[frame 21/48  full-range]
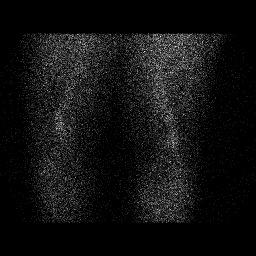
[frame 29/48  full-range]
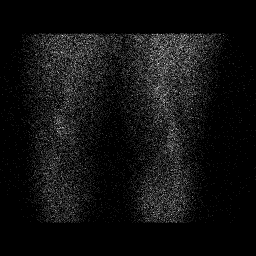
[frame 37/48  full-range]
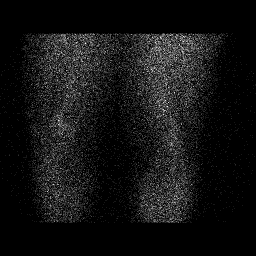
[frame 45/48  full-range]
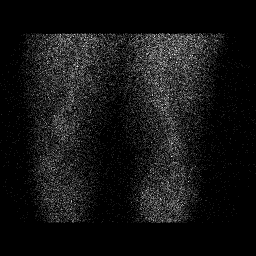

[Series 1: flow · 2.07mm/px · 6 of 48 frames shown (2 of 2)]
[frame 5/48  full-range]
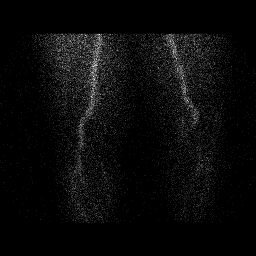
[frame 13/48  full-range]
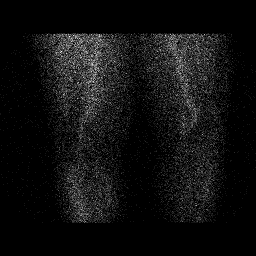
[frame 21/48  full-range]
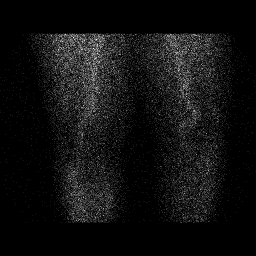
[frame 29/48  full-range]
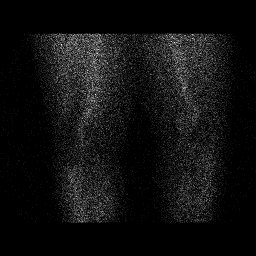
[frame 37/48  full-range]
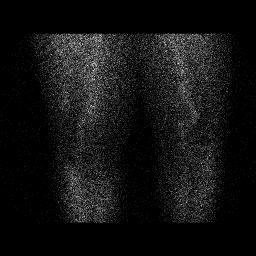
[frame 45/48  full-range]
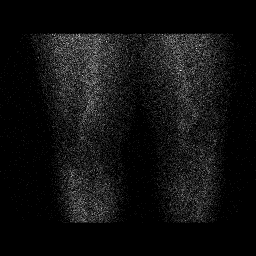

[Series 2: blood pool · 2.07mm/px · 1 of 1 slices shown (1 of 6)]
[im 1/1]
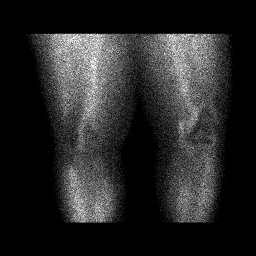

[Series 2: blood pool · 2.07mm/px · 1 of 1 slices shown (2 of 6)]
[im 1/1  full-range]
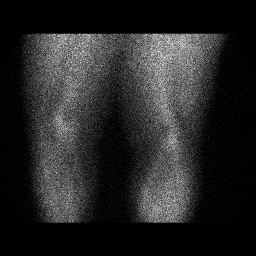

[Series 3: lat bp · 2.07mm/px · 1 of 1 slices shown (1 of 2)]
[im 1/1  full-range]
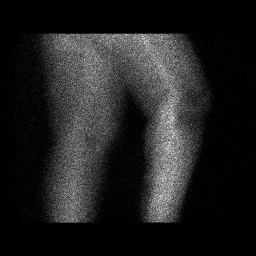

[Series 3: lat bp · 2.07mm/px · 1 of 1 slices shown (2 of 2)]
[im 1/1  full-range]
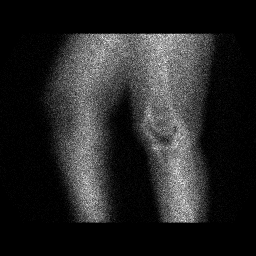

[Series 4: blood pool · 2.07mm/px · 1 of 1 slices shown (3 of 6)]
[im 1/1]
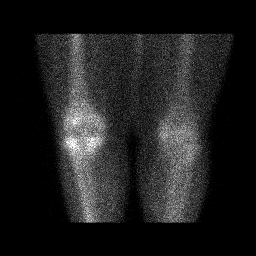

[Series 4: blood pool · 2.07mm/px · 1 of 1 slices shown (4 of 6)]
[im 1/1]
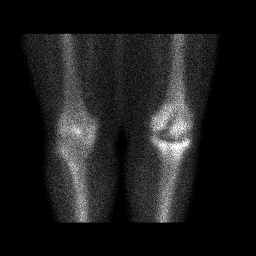

[Series 5: blood pool · 2.07mm/px · 1 of 1 slices shown (5 of 6)]
[im 1/1]
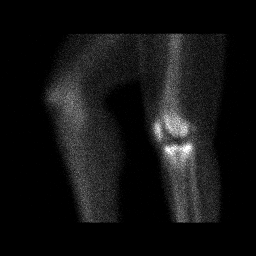

[Series 5: blood pool · 2.07mm/px · 1 of 1 slices shown (6 of 6)]
[im 1/1]
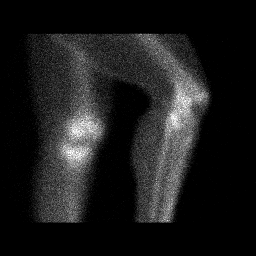

[20 of 20 positions shown; findings below may reference images not displayed]

FINDINGS: Vascular phase: There is symmetric radiotracer activity to both
lower extremities.

Blood pool phase: There is asymmetric increased radiotracer uptake
along the distribution of the synovial lining of the left knee.

Delayed phase: Mild increased radiotracer uptake within the distal
femur and proximal tibia noted.
IMPRESSION: 1. No specific findings identified to suggest arthroplasty device
loosening or infection.
2. Increased uptake localizing to the left knee on the blood pool
and delayed phase images is nonspecific within the first year status
post arthroplasty and may represent expected bone remodeling.

## 2020-03-27 DIAGNOSIS — E2839 Other primary ovarian failure: Secondary | ICD-10-CM | POA: Diagnosis not present

## 2020-04-07 DIAGNOSIS — I10 Essential (primary) hypertension: Secondary | ICD-10-CM | POA: Diagnosis not present

## 2020-05-09 DIAGNOSIS — I10 Essential (primary) hypertension: Secondary | ICD-10-CM | POA: Diagnosis not present

## 2020-05-30 ENCOUNTER — Telehealth: Payer: Self-pay | Admitting: Cardiology

## 2020-05-30 NOTE — Telephone Encounter (Signed)
Advised that [facility] Screening is fine.

## 2020-05-30 NOTE — Telephone Encounter (Signed)
Patient called stating that she is looking to do the [place] that is coming to [place].  She is wanting to know if Dr. Harl Bowie approves this.  Please call [phone] please leave a message.

## 2020-06-01 DIAGNOSIS — H903 Sensorineural hearing loss, bilateral: Secondary | ICD-10-CM | POA: Diagnosis not present

## 2020-06-08 DIAGNOSIS — I10 Essential (primary) hypertension: Secondary | ICD-10-CM | POA: Diagnosis not present

## 2020-06-12 DIAGNOSIS — Z299 Encounter for prophylactic measures, unspecified: Secondary | ICD-10-CM | POA: Diagnosis not present

## 2020-06-12 DIAGNOSIS — I10 Essential (primary) hypertension: Secondary | ICD-10-CM | POA: Diagnosis not present

## 2020-06-12 DIAGNOSIS — F322 Major depressive disorder, single episode, severe without psychotic features: Secondary | ICD-10-CM | POA: Diagnosis not present

## 2020-06-12 DIAGNOSIS — I471 Supraventricular tachycardia: Secondary | ICD-10-CM | POA: Diagnosis not present

## 2020-06-12 DIAGNOSIS — Z23 Encounter for immunization: Secondary | ICD-10-CM | POA: Diagnosis not present

## 2020-06-21 DIAGNOSIS — L821 Other seborrheic keratosis: Secondary | ICD-10-CM | POA: Diagnosis not present

## 2020-06-21 DIAGNOSIS — D1801 Hemangioma of skin and subcutaneous tissue: Secondary | ICD-10-CM | POA: Diagnosis not present

## 2020-06-21 DIAGNOSIS — D485 Neoplasm of uncertain behavior of skin: Secondary | ICD-10-CM | POA: Diagnosis not present

## 2020-06-21 DIAGNOSIS — L570 Actinic keratosis: Secondary | ICD-10-CM | POA: Diagnosis not present

## 2020-06-21 DIAGNOSIS — L814 Other melanin hyperpigmentation: Secondary | ICD-10-CM | POA: Diagnosis not present

## 2020-06-21 DIAGNOSIS — L509 Urticaria, unspecified: Secondary | ICD-10-CM | POA: Diagnosis not present

## 2020-06-26 DIAGNOSIS — L821 Other seborrheic keratosis: Secondary | ICD-10-CM | POA: Diagnosis not present

## 2020-07-08 DIAGNOSIS — I10 Essential (primary) hypertension: Secondary | ICD-10-CM | POA: Diagnosis not present

## 2020-07-12 DIAGNOSIS — H25812 Combined forms of age-related cataract, left eye: Secondary | ICD-10-CM | POA: Diagnosis not present

## 2020-07-12 DIAGNOSIS — H26491 Other secondary cataract, right eye: Secondary | ICD-10-CM | POA: Diagnosis not present

## 2020-07-12 DIAGNOSIS — H35363 Drusen (degenerative) of macula, bilateral: Secondary | ICD-10-CM | POA: Diagnosis not present

## 2020-07-12 DIAGNOSIS — Z961 Presence of intraocular lens: Secondary | ICD-10-CM | POA: Diagnosis not present

## 2020-07-12 DIAGNOSIS — H527 Unspecified disorder of refraction: Secondary | ICD-10-CM | POA: Diagnosis not present

## 2020-07-12 DIAGNOSIS — H16223 Keratoconjunctivitis sicca, not specified as Sjogren's, bilateral: Secondary | ICD-10-CM | POA: Diagnosis not present

## 2020-07-12 DIAGNOSIS — H40003 Preglaucoma, unspecified, bilateral: Secondary | ICD-10-CM | POA: Diagnosis not present

## 2020-07-19 DIAGNOSIS — Z23 Encounter for immunization: Secondary | ICD-10-CM | POA: Diagnosis not present

## 2020-08-08 ENCOUNTER — Encounter: Payer: Self-pay | Admitting: *Deleted

## 2020-08-08 ENCOUNTER — Ambulatory Visit (INDEPENDENT_AMBULATORY_CARE_PROVIDER_SITE_OTHER): Payer: Medicare Other | Admitting: Cardiology

## 2020-08-08 ENCOUNTER — Encounter: Payer: Self-pay | Admitting: Cardiology

## 2020-08-08 VITALS — BP 148/78 | HR 80 | Ht 62.0 in | Wt 167.4 lb

## 2020-08-08 DIAGNOSIS — I38 Endocarditis, valve unspecified: Secondary | ICD-10-CM

## 2020-08-08 DIAGNOSIS — I447 Left bundle-branch block, unspecified: Secondary | ICD-10-CM

## 2020-08-08 DIAGNOSIS — I340 Nonrheumatic mitral (valve) insufficiency: Secondary | ICD-10-CM

## 2020-08-08 DIAGNOSIS — I10 Essential (primary) hypertension: Secondary | ICD-10-CM

## 2020-08-08 NOTE — Progress Notes (Signed)
Clinical Summary Jane [name] is a 79 y.o.female seen today for follow up of the following medical problems.    1.ChronicLBBB - reports history 20 years of LBBB, she reports iniitally LBBB was rate related. Diagnosed in 1996, at that time rates 160-170 on treadmill stress she developed LBBB - heart cath 2005 was normal  - 11/2016 echo LVEF 55-60%.  - 12/2016 nuclear stress test no ischemia  - infrequent chest pains midchest, nonexertional. Some SOB she relates to decreased activity, limited by chronic knee pains - no recent edema.     2. Valvular heart disease - 11/2016 echo mild to mod MR, moderate TR - no recent symptoms  - mild SOB, no edema   3. HTN - does not check regularly at home. - compliant with meds  - issues with constipation on norvasc, was stopped and started on chlorthalidone 12.5mg  daily but did not start due to concerns about side effects, reported body very sensitive to medications  - bp's followed by pcp - pcp started chlorthalidone 25mg  daily.  - bps at home 120s-170s SBP at home. She does not sit for 5 minutes prior to taking.    - compliant with meds - pcp has home bp monitoring, at home SBPs in 120s. Has some component of white coat HTN.  - 2 years of dry cough, pcp has been reluctant to change ACE-I per her report due to prior issues with several other meds   SH: has had moderna vaccine x 3  Past Medical History:  Diagnosis Date  . Arthritis   . Depression   . H/O: rheumatic fever    as child  . Heart murmur   . Hx of colonic polyps   . Hypertension      Allergies  Allergen Reactions  . Ciprofloxacin Hcl Other (See Comments)    Hand stiffness   . Clindamycin/Lincomycin Other (See Comments)    Per patient, give me c-diff  . Flagyl [Metronidazole] Hives and Other (See Comments)    Questionable if this medication was the cause.  . Gabapentin     Dry cough, hoarseness  . Lodine [Etodolac] Other (See Comments)     Stomach pain   . Other Other (See Comments)    Has a very low tolerance to sedation and stimulating drugs   . Penicillins Diarrhea, Rash and Other (See Comments)    Has patient had a PCN reaction causing immediate rash, facial/tongue/throat swelling, SOB or lightheadedness with hypotension: unknown Has patient had a PCN reaction causing severe rash involving mucus membranes or skin necrosis: unknown Has patient had a PCN reaction that required hospitalization unknown Has patient had a PCN reaction occurring within the last 10 years: No If all of the above answers are "NO", then may proceed with Cephalosporin use.      Current Outpatient Medications  Medication Sig Dispense Refill  . acetaminophen (TYLENOL) 650 MG CR tablet Take 650 mg by mouth daily.    Marland Kitchen aspirin EC 81 MG tablet Take 81 mg by mouth at bedtime.    . Calcium Carbonate-Vitamin D (CALTRATE 600+D PO) Take 1 tablet by mouth 2 (two) times daily.    . Cholecalciferol (VITAMIN D3) 2000 units TABS Take 2,000 Units by mouth daily.    . citalopram (CELEXA) 40 MG tablet Take 40 mg by mouth at bedtime.     Marland Kitchen DICLOFENAC PO Take by mouth.    . diclofenac sodium (VOLTAREN) 1 % GEL Apply to affected area 3-4 times  daily PRN (Patient taking differently: Apply 1 application topically 4 (four) times daily as needed (for pain). ) 3 Tube 3  . hydrochlorothiazide (HYDRODIURIL) 25 MG tablet hydrochlorothiazide 25 mg tablet    . lisinopril (PRINIVIL,ZESTRIL) 20 MG tablet Take 20 mg by mouth 2 (two) times daily.    . magnesium oxide (MAG-OX) 400 MG tablet Take 400 mg by mouth daily.    . meclizine (ANTIVERT) 25 MG tablet Take 12.5-25 mg by mouth daily as needed for dizziness.     . Multiple Vitamins-Minerals (CENTRUM SILVER PO) Take 1 tablet by mouth daily.    . Omega-3 Fatty Acids (FISH OIL PO) Take 2 capsules by mouth daily.    . simvastatin (ZOCOR) 20 MG tablet Take 20 mg by mouth at bedtime.     No current facility-administered medications  for this visit.     Past Surgical History:  Procedure Laterality Date  . CARDIAC CATHETERIZATION    . COLONOSCOPY  06/03/2012   Procedure: COLONOSCOPY;  Surgeon: Rogene Houston, MD;  Location: [facility];  Service: Endoscopy;  Laterality: N/A;  1200  . COLONOSCOPY N/A 11/26/2017   Procedure: COLONOSCOPY;  Surgeon: Rogene Houston, MD;  Location: [facility];  Service: Endoscopy;  Laterality: N/A;  830  . FACIAL COSMETIC SURGERY    . KNEE ARTHROPLASTY    . RHINOPLASTY    . TOE SURGERY    . TONSILLECTOMY    . TOTAL KNEE ARTHROPLASTY Left 12/16/2016   Procedure: LEFT TOTAL KNEE ARTHROPLASTY;  Surgeon: Gaynelle Arabian, MD;  Location: [facility];  Service: Orthopedics;  Laterality: Left;  requests 60mins  . TUBAL LIGATION       Allergies  Allergen Reactions  . Ciprofloxacin Hcl Other (See Comments)    Hand stiffness   . Clindamycin/Lincomycin Other (See Comments)    Per patient, give me c-diff  . Flagyl [Metronidazole] Hives and Other (See Comments)    Questionable if this medication was the cause.  . Gabapentin     Dry cough, hoarseness  . Lodine [Etodolac] Other (See Comments)    Stomach pain   . Other Other (See Comments)    Has a very low tolerance to sedation and stimulating drugs   . Penicillins Diarrhea, Rash and Other (See Comments)    Has patient had a PCN reaction causing immediate rash, facial/tongue/throat swelling, SOB or lightheadedness with hypotension: unknown Has patient had a PCN reaction causing severe rash involving mucus membranes or skin necrosis: unknown Has patient had a PCN reaction that required hospitalization unknown Has patient had a PCN reaction occurring within the last 10 years: No If all of the above answers are "NO", then may proceed with Cephalosporin use.       Family History  Problem Relation Age of Onset  . Colon cancer Mother   . Heart Problems Father   . COPD Sister   . Hypertension Sister   . Diabetes Sister   . Thyroid disease  Sister   . Hypertension Sister   . Healthy Son   . Healthy Daughter      Social History Ms. Lux reports that she has never smoked. She has never used smokeless tobacco. Ms. Baskin reports current alcohol use.   Review of Systems CONSTITUTIONAL: No weight loss, fever, chills, weakness or fatigue.  HEENT: Eyes: No visual loss, blurred vision, double vision or yellow sclerae.No hearing loss, sneezing, congestion, runny nose or sore throat.  SKIN: No rash or itching.  CARDIOVASCULAR: per hpi RESPIRATORY:  No shortness of breath, cough or sputum.  GASTROINTESTINAL: No anorexia, nausea, vomiting or diarrhea. No abdominal pain or blood.  GENITOURINARY: No burning on urination, no polyuria NEUROLOGICAL: No headache, dizziness, syncope, paralysis, ataxia, numbness or tingling in the extremities. No change in bowel or bladder control.  MUSCULOSKELETAL: No muscle, back pain, joint pain or stiffness.  LYMPHATICS: No enlarged nodes. No history of splenectomy.  PSYCHIATRIC: No history of depression or anxiety.  ENDOCRINOLOGIC: No reports of sweating, cold or heat intolerance. No polyuria or polydipsia.  Marland Kitchen   Physical Examination Today's Vitals   08/08/20 0900  BP: (!) 148/78  Pulse: 80  SpO2: 98%  Weight: 167 lb 6.4 oz (75.9 kg)  Height: 5\' 2"  (1.575 m)   Body mass index is 30.62 kg/m.  Gen: resting comfortably, no acute distress HEENT: no scleral icterus, pupils equal round and reactive, no palptable cervical adenopathy,  CV: RRR, 2/6 systolic murmur apex, no jvd Resp: Clear to auscultation bilaterally GI: abdomen is soft, non-tender, non-distended, normal bowel sounds, no hepatosplenomegaly MSK: extremities are warm, no edema.  Skin: warm, no rash Neuro:  no focal deficits Psych: appropriate affect   Diagnostic Studies  11/2016 echo [facility] LVEF 55-60%,   12/2016 Nuclear stress  The study is normal.  This is a low risk study.  The left ventricular ejection fraction is  normal (55-65%).  There was no ST segment deviation noted during stress.  Normal resting and stress perfusion. No ischemia or infarction EF 54%   Assessment and Plan   1.ChronicLBBB -no evidence of underlying significant heart diseaseby extensive workup  -continue to monitor - EKG today shows SR, LBBB  2. Valvular heart disease - 3 years since last echo, with some recent DOE will repeat echo  3. HTN - home numbers overall at goal, continue current meds    Arnoldo Lenis, M.D.

## 2020-08-08 NOTE — Patient Instructions (Addendum)
Your physician recommends that you schedule a follow-up appointment in: [place]  Your physician recommends that you continue on your current medications as directed. Please refer to the Current Medication list given to you today.  Your physician has requested that you have an echocardiogram. Echocardiography is a painless test that uses sound waves to create images of your heart. It provides your doctor with information about the size and shape of your heart and how well your hearts chambers and valves are working. This procedure takes approximately one hour. There are no restrictions for this procedure.  Thank you for choosing [facility]!!

## 2020-08-16 ENCOUNTER — Ambulatory Visit (HOSPITAL_COMMUNITY)
Admission: RE | Admit: 2020-08-16 | Discharge: 2020-08-16 | Disposition: A | Discharge status: Home / Self Care | Payer: Medicare Other | Source: Ambulatory Visit | Attending: Cardiology | Admitting: Cardiology

## 2020-08-16 ENCOUNTER — Other Ambulatory Visit: Payer: Self-pay

## 2020-08-16 DIAGNOSIS — I340 Nonrheumatic mitral (valve) insufficiency: Secondary | ICD-10-CM | POA: Diagnosis not present

## 2020-08-16 LAB — ECHOCARDIOGRAM COMPLETE
AR max vel: 1.54 cm2
AV Area VTI: 1.51 cm2
AV Area mean vel: 1.49 cm2
AV Mean grad: 8.8 mmHg
AV Peak grad: 15.2 mmHg
Ao pk vel: 1.95 m/s
Area-P 1/2: 2.46 cm2
S' Lateral: 2.9 cm

## 2020-08-16 NOTE — Progress Notes (Signed)
*  PRELIMINARY RESULTS* Echocardiogram 2D Echocardiogram has been performed.  Jane [name] 08/16/2020, 12:49 PM

## 2020-08-21 ENCOUNTER — Telehealth: Payer: Self-pay | Admitting: Cardiology

## 2020-08-21 NOTE — Telephone Encounter (Signed)
Jane Lenis, MD  08/21/2020 9:26 AM EST      Echo looks good, heart function remains normal.   Zandra Abts MD   Patient informed, copied Dr.Vyas

## 2020-08-21 NOTE — Telephone Encounter (Signed)
New message    Patient called wanting her echo results

## 2020-08-23 DIAGNOSIS — I10 Essential (primary) hypertension: Secondary | ICD-10-CM | POA: Diagnosis not present

## 2020-08-23 DIAGNOSIS — R053 Chronic cough: Secondary | ICD-10-CM | POA: Diagnosis not present

## 2020-08-23 DIAGNOSIS — Z299 Encounter for prophylactic measures, unspecified: Secondary | ICD-10-CM | POA: Diagnosis not present

## 2020-08-23 DIAGNOSIS — Z6830 Body mass index (BMI) 30.0-30.9, adult: Secondary | ICD-10-CM | POA: Diagnosis not present

## 2020-08-23 DIAGNOSIS — F322 Major depressive disorder, single episode, severe without psychotic features: Secondary | ICD-10-CM | POA: Diagnosis not present

## 2020-08-23 DIAGNOSIS — M79644 Pain in right finger(s): Secondary | ICD-10-CM | POA: Diagnosis not present

## 2020-08-23 DIAGNOSIS — I471 Supraventricular tachycardia: Secondary | ICD-10-CM | POA: Diagnosis not present

## 2020-08-24 ENCOUNTER — Telehealth: Payer: Self-pay | Admitting: *Deleted

## 2020-08-24 NOTE — Telephone Encounter (Signed)
Pt aware.

## 2020-08-24 NOTE — Telephone Encounter (Signed)
-----   Message from Arnoldo Lenis, MD sent at 08/21/2020  9:26 AM EST ----- Echo looks good, heart function remains normal.   Zandra Abts MD

## 2020-08-29 DIAGNOSIS — Z888 Allergy status to other drugs, medicaments and biological substances status: Secondary | ICD-10-CM | POA: Diagnosis not present

## 2020-08-29 DIAGNOSIS — H25812 Combined forms of age-related cataract, left eye: Secondary | ICD-10-CM | POA: Diagnosis not present

## 2020-08-29 DIAGNOSIS — I10 Essential (primary) hypertension: Secondary | ICD-10-CM | POA: Diagnosis not present

## 2020-08-29 DIAGNOSIS — Z79899 Other long term (current) drug therapy: Secondary | ICD-10-CM | POA: Diagnosis not present

## 2020-08-29 DIAGNOSIS — M1990 Unspecified osteoarthritis, unspecified site: Secondary | ICD-10-CM | POA: Diagnosis not present

## 2020-08-30 DIAGNOSIS — Z961 Presence of intraocular lens: Secondary | ICD-10-CM | POA: Diagnosis not present

## 2020-09-07 DIAGNOSIS — I10 Essential (primary) hypertension: Secondary | ICD-10-CM | POA: Diagnosis not present

## 2020-09-18 DIAGNOSIS — H2512 Age-related nuclear cataract, left eye: Secondary | ICD-10-CM | POA: Diagnosis not present

## 2020-10-09 DIAGNOSIS — I10 Essential (primary) hypertension: Secondary | ICD-10-CM | POA: Diagnosis not present

## 2020-11-13 DIAGNOSIS — Z1231 Encounter for screening mammogram for malignant neoplasm of breast: Secondary | ICD-10-CM | POA: Diagnosis not present

## 2020-11-21 DIAGNOSIS — R42 Dizziness and giddiness: Secondary | ICD-10-CM | POA: Diagnosis not present

## 2020-11-21 DIAGNOSIS — J309 Allergic rhinitis, unspecified: Secondary | ICD-10-CM | POA: Diagnosis not present

## 2020-11-21 DIAGNOSIS — Z299 Encounter for prophylactic measures, unspecified: Secondary | ICD-10-CM | POA: Diagnosis not present

## 2020-11-21 DIAGNOSIS — I471 Supraventricular tachycardia: Secondary | ICD-10-CM | POA: Diagnosis not present

## 2020-11-21 DIAGNOSIS — I10 Essential (primary) hypertension: Secondary | ICD-10-CM | POA: Diagnosis not present

## 2020-12-27 DIAGNOSIS — K136 Irritative hyperplasia of oral mucosa: Secondary | ICD-10-CM | POA: Diagnosis not present

## 2021-01-03 DIAGNOSIS — R5383 Other fatigue: Secondary | ICD-10-CM | POA: Diagnosis not present

## 2021-01-03 DIAGNOSIS — Z299 Encounter for prophylactic measures, unspecified: Secondary | ICD-10-CM | POA: Diagnosis not present

## 2021-01-03 DIAGNOSIS — I10 Essential (primary) hypertension: Secondary | ICD-10-CM | POA: Diagnosis not present

## 2021-02-19 ENCOUNTER — Encounter: Payer: Self-pay | Admitting: Cardiology

## 2021-02-19 ENCOUNTER — Encounter: Payer: Self-pay | Admitting: *Deleted

## 2021-02-19 ENCOUNTER — Ambulatory Visit: Payer: Medicare Other | Admitting: Cardiology

## 2021-02-19 VITALS — BP 150/70 | HR 79 | Ht 62.0 in | Wt 168.0 lb

## 2021-02-19 DIAGNOSIS — I10 Essential (primary) hypertension: Secondary | ICD-10-CM

## 2021-02-19 DIAGNOSIS — I447 Left bundle-branch block, unspecified: Secondary | ICD-10-CM | POA: Diagnosis not present

## 2021-02-19 DIAGNOSIS — I38 Endocarditis, valve unspecified: Secondary | ICD-10-CM

## 2021-02-19 NOTE — Progress Notes (Signed)
Clinical Summary Jane [name] is a 80 y.o.female seen today for follow up of the following medical problems.     1. Chronic LBBB - reports history 20 years of LBBB, she reports iniitally LBBB was rate related. Diagnosed in 1996, at that time rates 160-170 on treadmill stress she developed LBBB - heart cath 2005 was normal   - 11/2016 echo LVEF 55-60%. - 12/2016 nuclear stress test no ischemia     - no recent symptoms.      2. Valvular heart disease - 11/2016 echo mild to mod MR, moderate TR - no recent symptoms   08/2020 echo LVEF 60-65%, no significant valve dysfunction      3. HTN - does not check regularly at home.  - compliant with meds   - issues with constipation on norvasc  - compliant with meds - pcp has home bp monitoring, at home SBPs in 120s. Has some component of white coat HTN. - 2 years of dry cough, pcp has been reluctant to change ACE-I per her report due to prior issues with several other meds   - no longer monitoring bp's at home, had been in the 120s-130s/60    Past Medical History:  Diagnosis Date   Arthritis    Depression    H/O: rheumatic fever    as child   Heart murmur    Hx of colonic polyps    Hypertension      Allergies  Allergen Reactions   Ciprofloxacin Hcl Other (See Comments)    Hand stiffness    Clindamycin/Lincomycin Other (See Comments)    Per patient, give me c-diff   Flagyl [Metronidazole] Hives and Other (See Comments)    Questionable if this medication was the cause.   Gabapentin     Dry cough, hoarseness   Lodine [Etodolac] Other (See Comments)    Stomach pain    Other Other (See Comments)    Has a very low tolerance to sedation and stimulating drugs    Penicillins Diarrhea, Rash and Other (See Comments)    Has patient had a PCN reaction causing immediate rash, facial/tongue/throat swelling, SOB or lightheadedness with hypotension: unknown Has patient had a PCN reaction causing severe rash involving mucus  membranes or skin necrosis: unknown Has patient had a PCN reaction that required hospitalization unknown Has patient had a PCN reaction occurring within the last 10 years: No If all of the above answers are "NO", then may proceed with Cephalosporin use.      Current Outpatient Medications  Medication Sig Dispense Refill   acetaminophen (TYLENOL) 650 MG CR tablet Take 650 mg by mouth daily.     aspirin EC 81 MG tablet Take 81 mg by mouth at bedtime.     Calcium Carbonate-Vitamin D (CALTRATE 600+D PO) Take 1 tablet by mouth 2 (two) times daily.     Cholecalciferol (VITAMIN D3) 2000 units TABS Take 2,000 Units by mouth daily.     citalopram (CELEXA) 40 MG tablet Take 40 mg by mouth at bedtime.      DICLOFENAC PO Take by mouth.     diclofenac sodium (VOLTAREN) 1 % GEL Apply to affected area 3-4 times daily PRN (Patient taking differently: Apply 1 application topically 4 (four) times daily as needed (for pain). ) 3 Tube 3   hydrochlorothiazide (HYDRODIURIL) 25 MG tablet hydrochlorothiazide 25 mg tablet     lisinopril (PRINIVIL,ZESTRIL) 20 MG tablet Take 20 mg by mouth 2 (two) times daily.  magnesium oxide (MAG-OX) 400 MG tablet Take 400 mg by mouth daily.     meclizine (ANTIVERT) 25 MG tablet Take 12.5-25 mg by mouth daily as needed for dizziness.      Multiple Vitamins-Minerals (CENTRUM SILVER PO) Take 1 tablet by mouth daily.     Omega-3 Fatty Acids (FISH OIL PO) Take 2 capsules by mouth daily.     simvastatin (ZOCOR) 20 MG tablet Take 20 mg by mouth at bedtime.     No current facility-administered medications for this visit.     Past Surgical History:  Procedure Laterality Date   CARDIAC CATHETERIZATION     COLONOSCOPY  06/03/2012   Procedure: COLONOSCOPY;  Surgeon: Rogene Houston, MD;  [facility]: [facility];  Service: Endoscopy;  Laterality: N/A;  1200   COLONOSCOPY N/A 11/26/2017   Procedure: COLONOSCOPY;  Surgeon: Rogene Houston, MD;  Location: [facility];  Service:  Endoscopy;  Laterality: N/A;  830   FACIAL COSMETIC SURGERY     KNEE ARTHROPLASTY     RHINOPLASTY     TOE SURGERY     TONSILLECTOMY     TOTAL KNEE ARTHROPLASTY Left 12/16/2016   Procedure: LEFT TOTAL KNEE ARTHROPLASTY;  Surgeon: Gaynelle Arabian, MD;  Location: [facility];  Service: Orthopedics;  Laterality: Left;  requests 8mins   TUBAL LIGATION       Allergies  Allergen Reactions   Ciprofloxacin Hcl Other (See Comments)    Hand stiffness    Clindamycin/Lincomycin Other (See Comments)    Per patient, give me c-diff   Flagyl [Metronidazole] Hives and Other (See Comments)    Questionable if this medication was the cause.   Gabapentin     Dry cough, hoarseness   Lodine [Etodolac] Other (See Comments)    Stomach pain    Other Other (See Comments)    Has a very low tolerance to sedation and stimulating drugs    Penicillins Diarrhea, Rash and Other (See Comments)    Has patient had a PCN reaction causing immediate rash, facial/tongue/throat swelling, SOB or lightheadedness with hypotension: unknown Has patient had a PCN reaction causing severe rash involving mucus membranes or skin necrosis: unknown Has patient had a PCN reaction that required hospitalization unknown Has patient had a PCN reaction occurring within the last 10 years: No If all of the above answers are "NO", then may proceed with Cephalosporin use.       Family History  Problem Relation Age of Onset   Colon cancer Mother    Heart Problems Father    COPD Sister    Hypertension Sister    Diabetes Sister    Thyroid disease Sister    Hypertension Sister    Healthy Son    Healthy Daughter      Social History Jane [name] reports that she has never smoked. She has never used smokeless tobacco. Jane [name] reports current alcohol use.   Review of Systems CONSTITUTIONAL: No weight loss, fever, chills, weakness or fatigue.  HEENT: Eyes: No visual loss, blurred vision, double vision or yellow sclerae.No hearing loss,  sneezing, congestion, runny nose or sore throat.  SKIN: No rash or itching.  CARDIOVASCULAR: per hpi RESPIRATORY: No shortness of breath, cough or sputum.  GASTROINTESTINAL: No anorexia, nausea, vomiting or diarrhea. No abdominal pain or blood.  GENITOURINARY: No burning on urination, no polyuria NEUROLOGICAL: No headache, dizziness, syncope, paralysis, ataxia, numbness or tingling in the extremities. No change in bowel or bladder control.  MUSCULOSKELETAL: No muscle, back pain,  joint pain or stiffness.  LYMPHATICS: No enlarged nodes. No history of splenectomy.  PSYCHIATRIC: No history of depression or anxiety.  ENDOCRINOLOGIC: No reports of sweating, cold or heat intolerance. No polyuria or polydipsia.  Marland Kitchen   Physical Examination Today's Vitals   02/19/21 1056  BP: (!) 150/70  Pulse: 79  SpO2: 97%  Weight: 168 lb (76.2 kg)  Height: 5\' 2"  (1.575 m)   Body mass index is 30.73 kg/m.  Gen: resting comfortably, no acute distress HEENT: no scleral icterus, pupils equal round and reactive, no palptable cervical adenopathy,  CV: RRR, no m/r,g no jvd Resp: Clear to auscultation bilaterally GI: abdomen is soft, non-tender, non-distended, normal bowel sounds, no hepatosplenomegaly MSK: extremities are warm, no edema.  Skin: warm, no rash Neuro:  no focal deficits Psych: appropriate affect   Diagnostic Studies  11/2016 echo [facility] LVEF 55-60%,   12/2016 Nuclear stress The study is normal. This is a low risk study. The left ventricular ejection fraction is normal (55-65%). There was no ST segment deviation noted during stress.   Normal resting and stress perfusion. No ischemia or infarction EF 54%    08/2020 echo IMPRESSIONS     1. Left ventricular ejection fraction, by estimation, is 60 to 65%. The  left ventricle has normal function. The left ventricle has no regional  wall motion abnormalities. Left ventricular diastolic parameters were  normal.   2. Right ventricular  systolic function is normal. The right ventricular  size is normal. There is normal pulmonary artery systolic pressure.   3. Left atrial size was mildly dilated.   4. The mitral valve is normal in structure. No evidence of mitral valve  regurgitation. No evidence of mitral stenosis.   5. The aortic valve is tricuspid. Aortic valve regurgitation is not  visualized. No aortic stenosis is present.   6. The inferior vena cava is normal in size with greater than 50%  respiratory variability, suggesting right atrial pressure of 3 mmHg.     Assessment and Plan  1.Chronic LBBB - no evidence of underlying significant heart disease by extensive workup  -no symptoms, continue to monitor   2. Valvular heart disease - most recent echo without significant valve disease, 2018 study had suggested MR and TR - continue to monitor   3. HTN - typically elevated in clinic, had extensive home monotiring by pcp and bp's were at goal - continue current meds. Historically sensitive to medications changes   F/u 1 year  Arnoldo Lenis, M.D.

## 2021-02-19 NOTE — Patient Instructions (Signed)

## 2021-03-09 DIAGNOSIS — R5383 Other fatigue: Secondary | ICD-10-CM | POA: Diagnosis not present

## 2021-03-09 DIAGNOSIS — Z79899 Other long term (current) drug therapy: Secondary | ICD-10-CM | POA: Diagnosis not present

## 2021-03-09 DIAGNOSIS — I10 Essential (primary) hypertension: Secondary | ICD-10-CM | POA: Diagnosis not present

## 2021-03-09 DIAGNOSIS — E7800 Pure hypercholesterolemia, unspecified: Secondary | ICD-10-CM | POA: Diagnosis not present

## 2021-03-09 DIAGNOSIS — Z789 Other specified health status: Secondary | ICD-10-CM | POA: Diagnosis not present

## 2021-03-09 DIAGNOSIS — Z7189 Other specified counseling: Secondary | ICD-10-CM | POA: Diagnosis not present

## 2021-03-09 DIAGNOSIS — Z299 Encounter for prophylactic measures, unspecified: Secondary | ICD-10-CM | POA: Diagnosis not present

## 2021-03-09 DIAGNOSIS — Z0000 Encounter for general adult medical examination without abnormal findings: Secondary | ICD-10-CM | POA: Diagnosis not present

## 2021-03-19 DIAGNOSIS — R5383 Other fatigue: Secondary | ICD-10-CM | POA: Diagnosis not present

## 2021-03-19 DIAGNOSIS — Z79899 Other long term (current) drug therapy: Secondary | ICD-10-CM | POA: Diagnosis not present

## 2021-03-19 DIAGNOSIS — E7800 Pure hypercholesterolemia, unspecified: Secondary | ICD-10-CM | POA: Diagnosis not present

## 2021-04-10 DIAGNOSIS — H35033 Hypertensive retinopathy, bilateral: Secondary | ICD-10-CM | POA: Diagnosis not present

## 2021-05-03 DIAGNOSIS — Z299 Encounter for prophylactic measures, unspecified: Secondary | ICD-10-CM | POA: Diagnosis not present

## 2021-05-03 DIAGNOSIS — M1611 Unilateral primary osteoarthritis, right hip: Secondary | ICD-10-CM | POA: Diagnosis not present

## 2021-05-03 DIAGNOSIS — M160 Bilateral primary osteoarthritis of hip: Secondary | ICD-10-CM | POA: Diagnosis not present

## 2021-05-03 DIAGNOSIS — M25551 Pain in right hip: Secondary | ICD-10-CM | POA: Diagnosis not present

## 2021-05-03 DIAGNOSIS — M79651 Pain in right thigh: Secondary | ICD-10-CM | POA: Diagnosis not present

## 2021-05-22 DIAGNOSIS — R0981 Nasal congestion: Secondary | ICD-10-CM | POA: Diagnosis not present

## 2021-05-22 DIAGNOSIS — J3489 Other specified disorders of nose and nasal sinuses: Secondary | ICD-10-CM | POA: Diagnosis not present

## 2021-05-22 DIAGNOSIS — R490 Dysphonia: Secondary | ICD-10-CM | POA: Diagnosis not present

## 2021-05-23 DIAGNOSIS — R296 Repeated falls: Secondary | ICD-10-CM | POA: Diagnosis not present

## 2021-05-23 DIAGNOSIS — Z23 Encounter for immunization: Secondary | ICD-10-CM | POA: Diagnosis not present

## 2021-05-23 DIAGNOSIS — Z299 Encounter for prophylactic measures, unspecified: Secondary | ICD-10-CM | POA: Diagnosis not present

## 2021-05-23 DIAGNOSIS — I10 Essential (primary) hypertension: Secondary | ICD-10-CM | POA: Diagnosis not present

## 2021-05-31 DIAGNOSIS — M25561 Pain in right knee: Secondary | ICD-10-CM | POA: Diagnosis not present

## 2021-05-31 DIAGNOSIS — M25552 Pain in left hip: Secondary | ICD-10-CM | POA: Diagnosis not present

## 2021-05-31 DIAGNOSIS — R296 Repeated falls: Secondary | ICD-10-CM | POA: Diagnosis not present

## 2021-05-31 DIAGNOSIS — R531 Weakness: Secondary | ICD-10-CM | POA: Diagnosis not present

## 2021-06-04 DIAGNOSIS — M25561 Pain in right knee: Secondary | ICD-10-CM | POA: Diagnosis not present

## 2021-06-04 DIAGNOSIS — R531 Weakness: Secondary | ICD-10-CM | POA: Diagnosis not present

## 2021-06-04 DIAGNOSIS — M25552 Pain in left hip: Secondary | ICD-10-CM | POA: Diagnosis not present

## 2021-06-04 DIAGNOSIS — R296 Repeated falls: Secondary | ICD-10-CM | POA: Diagnosis not present

## 2021-06-05 DIAGNOSIS — H40013 Open angle with borderline findings, low risk, bilateral: Secondary | ICD-10-CM | POA: Diagnosis not present

## 2021-06-07 DIAGNOSIS — Z96652 Presence of left artificial knee joint: Secondary | ICD-10-CM | POA: Diagnosis not present

## 2021-06-13 DIAGNOSIS — I739 Peripheral vascular disease, unspecified: Secondary | ICD-10-CM | POA: Diagnosis not present

## 2021-06-13 DIAGNOSIS — I471 Supraventricular tachycardia: Secondary | ICD-10-CM | POA: Diagnosis not present

## 2021-06-13 DIAGNOSIS — R5383 Other fatigue: Secondary | ICD-10-CM | POA: Diagnosis not present

## 2021-06-13 DIAGNOSIS — I10 Essential (primary) hypertension: Secondary | ICD-10-CM | POA: Diagnosis not present

## 2021-06-13 DIAGNOSIS — Z79899 Other long term (current) drug therapy: Secondary | ICD-10-CM | POA: Diagnosis not present

## 2021-06-13 DIAGNOSIS — Z299 Encounter for prophylactic measures, unspecified: Secondary | ICD-10-CM | POA: Diagnosis not present

## 2021-06-13 DIAGNOSIS — K529 Noninfective gastroenteritis and colitis, unspecified: Secondary | ICD-10-CM | POA: Diagnosis not present

## 2021-06-18 DIAGNOSIS — M25552 Pain in left hip: Secondary | ICD-10-CM | POA: Diagnosis not present

## 2021-06-18 DIAGNOSIS — R531 Weakness: Secondary | ICD-10-CM | POA: Diagnosis not present

## 2021-06-18 DIAGNOSIS — R296 Repeated falls: Secondary | ICD-10-CM | POA: Diagnosis not present

## 2021-06-18 DIAGNOSIS — M25561 Pain in right knee: Secondary | ICD-10-CM | POA: Diagnosis not present

## 2021-06-27 DIAGNOSIS — R531 Weakness: Secondary | ICD-10-CM | POA: Diagnosis not present

## 2021-06-27 DIAGNOSIS — M25552 Pain in left hip: Secondary | ICD-10-CM | POA: Diagnosis not present

## 2021-06-27 DIAGNOSIS — R296 Repeated falls: Secondary | ICD-10-CM | POA: Diagnosis not present

## 2021-06-27 DIAGNOSIS — M25561 Pain in right knee: Secondary | ICD-10-CM | POA: Diagnosis not present

## 2021-06-28 DIAGNOSIS — M25561 Pain in right knee: Secondary | ICD-10-CM | POA: Diagnosis not present

## 2021-06-28 DIAGNOSIS — R296 Repeated falls: Secondary | ICD-10-CM | POA: Diagnosis not present

## 2021-06-28 DIAGNOSIS — R531 Weakness: Secondary | ICD-10-CM | POA: Diagnosis not present

## 2021-06-28 DIAGNOSIS — M25552 Pain in left hip: Secondary | ICD-10-CM | POA: Diagnosis not present

## 2021-07-03 DIAGNOSIS — R296 Repeated falls: Secondary | ICD-10-CM | POA: Diagnosis not present

## 2021-07-03 DIAGNOSIS — M25561 Pain in right knee: Secondary | ICD-10-CM | POA: Diagnosis not present

## 2021-07-03 DIAGNOSIS — R531 Weakness: Secondary | ICD-10-CM | POA: Diagnosis not present

## 2021-07-03 DIAGNOSIS — M25552 Pain in left hip: Secondary | ICD-10-CM | POA: Diagnosis not present

## 2021-07-11 DIAGNOSIS — M25561 Pain in right knee: Secondary | ICD-10-CM | POA: Diagnosis not present

## 2021-07-11 DIAGNOSIS — R296 Repeated falls: Secondary | ICD-10-CM | POA: Diagnosis not present

## 2021-07-11 DIAGNOSIS — M25552 Pain in left hip: Secondary | ICD-10-CM | POA: Diagnosis not present

## 2021-07-11 DIAGNOSIS — R531 Weakness: Secondary | ICD-10-CM | POA: Diagnosis not present

## 2021-07-13 DIAGNOSIS — M25561 Pain in right knee: Secondary | ICD-10-CM | POA: Diagnosis not present

## 2021-07-13 DIAGNOSIS — M25552 Pain in left hip: Secondary | ICD-10-CM | POA: Diagnosis not present

## 2021-07-13 DIAGNOSIS — R531 Weakness: Secondary | ICD-10-CM | POA: Diagnosis not present

## 2021-07-13 DIAGNOSIS — R296 Repeated falls: Secondary | ICD-10-CM | POA: Diagnosis not present

## 2021-07-18 DIAGNOSIS — R531 Weakness: Secondary | ICD-10-CM | POA: Diagnosis not present

## 2021-07-18 DIAGNOSIS — M25552 Pain in left hip: Secondary | ICD-10-CM | POA: Diagnosis not present

## 2021-07-18 DIAGNOSIS — M25561 Pain in right knee: Secondary | ICD-10-CM | POA: Diagnosis not present

## 2021-07-18 DIAGNOSIS — R296 Repeated falls: Secondary | ICD-10-CM | POA: Diagnosis not present

## 2021-07-20 DIAGNOSIS — M25552 Pain in left hip: Secondary | ICD-10-CM | POA: Diagnosis not present

## 2021-07-20 DIAGNOSIS — M25561 Pain in right knee: Secondary | ICD-10-CM | POA: Diagnosis not present

## 2021-07-20 DIAGNOSIS — R296 Repeated falls: Secondary | ICD-10-CM | POA: Diagnosis not present

## 2021-07-20 DIAGNOSIS — R531 Weakness: Secondary | ICD-10-CM | POA: Diagnosis not present

## 2021-07-25 DIAGNOSIS — M25561 Pain in right knee: Secondary | ICD-10-CM | POA: Diagnosis not present

## 2021-07-25 DIAGNOSIS — R531 Weakness: Secondary | ICD-10-CM | POA: Diagnosis not present

## 2021-07-25 DIAGNOSIS — M25552 Pain in left hip: Secondary | ICD-10-CM | POA: Diagnosis not present

## 2021-07-25 DIAGNOSIS — R296 Repeated falls: Secondary | ICD-10-CM | POA: Diagnosis not present

## 2021-07-27 DIAGNOSIS — M25561 Pain in right knee: Secondary | ICD-10-CM | POA: Diagnosis not present

## 2021-07-27 DIAGNOSIS — R296 Repeated falls: Secondary | ICD-10-CM | POA: Diagnosis not present

## 2021-07-27 DIAGNOSIS — R531 Weakness: Secondary | ICD-10-CM | POA: Diagnosis not present

## 2021-07-27 DIAGNOSIS — M25552 Pain in left hip: Secondary | ICD-10-CM | POA: Diagnosis not present

## 2021-09-26 DIAGNOSIS — L570 Actinic keratosis: Secondary | ICD-10-CM | POA: Diagnosis not present

## 2021-09-26 DIAGNOSIS — D239 Other benign neoplasm of skin, unspecified: Secondary | ICD-10-CM | POA: Diagnosis not present

## 2021-11-07 DIAGNOSIS — H905 Unspecified sensorineural hearing loss: Secondary | ICD-10-CM | POA: Diagnosis not present

## 2021-11-22 DIAGNOSIS — Z1231 Encounter for screening mammogram for malignant neoplasm of breast: Secondary | ICD-10-CM | POA: Diagnosis not present

## 2021-11-30 DIAGNOSIS — Z299 Encounter for prophylactic measures, unspecified: Secondary | ICD-10-CM | POA: Diagnosis not present

## 2021-11-30 DIAGNOSIS — J329 Chronic sinusitis, unspecified: Secondary | ICD-10-CM | POA: Diagnosis not present

## 2021-11-30 DIAGNOSIS — I10 Essential (primary) hypertension: Secondary | ICD-10-CM | POA: Diagnosis not present

## 2021-11-30 DIAGNOSIS — R5383 Other fatigue: Secondary | ICD-10-CM | POA: Diagnosis not present

## 2021-12-06 DIAGNOSIS — I739 Peripheral vascular disease, unspecified: Secondary | ICD-10-CM | POA: Diagnosis not present

## 2021-12-06 DIAGNOSIS — J069 Acute upper respiratory infection, unspecified: Secondary | ICD-10-CM | POA: Diagnosis not present

## 2021-12-06 DIAGNOSIS — I10 Essential (primary) hypertension: Secondary | ICD-10-CM | POA: Diagnosis not present

## 2021-12-06 DIAGNOSIS — Z299 Encounter for prophylactic measures, unspecified: Secondary | ICD-10-CM | POA: Diagnosis not present

## 2021-12-06 DIAGNOSIS — I471 Supraventricular tachycardia: Secondary | ICD-10-CM | POA: Diagnosis not present

## 2021-12-31 DIAGNOSIS — H40011 Open angle with borderline findings, low risk, right eye: Secondary | ICD-10-CM | POA: Diagnosis not present

## 2022-02-19 ENCOUNTER — Encounter: Payer: Self-pay | Admitting: Cardiology

## 2022-02-19 ENCOUNTER — Ambulatory Visit: Payer: Medicare Other | Admitting: Cardiology

## 2022-02-19 VITALS — BP 118/70 | HR 70 | Ht 62.0 in | Wt 160.2 lb

## 2022-02-19 DIAGNOSIS — I447 Left bundle-branch block, unspecified: Secondary | ICD-10-CM

## 2022-02-19 DIAGNOSIS — R0989 Other specified symptoms and signs involving the circulatory and respiratory systems: Secondary | ICD-10-CM | POA: Diagnosis not present

## 2022-02-19 DIAGNOSIS — I10 Essential (primary) hypertension: Secondary | ICD-10-CM

## 2022-02-19 NOTE — Progress Notes (Signed)
Clinical Summary Jane [name] is a 81 y.o.female seen today for follow up of the following medical problems.     1. Chronic LBBB - reports history 20 years of LBBB, she reports iniitally LBBB was rate related. Diagnosed in 1996, at that time rates 160-170 on treadmill stress she developed LBBB - heart cath 2005 was normal   - 11/2016 echo LVEF 55-60%. - 12/2016 nuclear stress test no ischemia  - no recent issues       2. Valvular heart disease - 11/2016 echo mild to mod MR, moderate TR - no recent symptoms     08/2020 echo LVEF 60-65%, no significant valve dysfunction - no recent symptoms.        3. HTN    - issues with constipation on norvasc  - compliant with meds - pcp has home bp monitoring, at home SBPs in 120s. Has some component of white coat HTN. - 2 years of dry cough, pcp has been reluctant to change ACE-I per her report due to prior issues with several other meds  - compliant with meds     4. Hyperlipidemia - she is on simvastastin  Past Medical History:  Diagnosis Date   Arthritis    Depression    H/O: rheumatic fever    as child   Heart murmur    Hx of colonic polyps    Hypertension      Allergies  Allergen Reactions   Ciprofloxacin Hcl Other (See Comments)    Hand stiffness    Clindamycin/Lincomycin Other (See Comments)    Per patient, give me c-diff   Flagyl [Metronidazole] Hives and Other (See Comments)    Questionable if this medication was the cause.   Gabapentin     Dry cough, hoarseness   Lodine [Etodolac] Other (See Comments)    Stomach pain    Other Other (See Comments)    Has a very low tolerance to sedation and stimulating drugs    Penicillins Diarrhea, Rash and Other (See Comments)    Has patient had a PCN reaction causing immediate rash, facial/tongue/throat swelling, SOB or lightheadedness with hypotension: unknown Has patient had a PCN reaction causing severe rash involving mucus membranes or skin necrosis: unknown Has  patient had a PCN reaction that required hospitalization unknown Has patient had a PCN reaction occurring within the last 10 years: No If all of the above answers are "NO", then may proceed with Cephalosporin use.      Current Outpatient Medications  Medication Sig Dispense Refill   acetaminophen (TYLENOL) 650 MG CR tablet Take 325 mg by mouth daily.     ALPRAZolam (XANAX) 0.5 MG tablet Take 0.5 mg by mouth 2 (two) times daily as needed.     aspirin EC 81 MG tablet Take 81 mg by mouth at bedtime.     Calcium Carbonate-Vitamin D (CALTRATE 600+D PO) Take 1 tablet by mouth 2 (two) times daily.     Cholecalciferol (VITAMIN D3) 2000 units TABS Take 2,000 Units by mouth daily.     citalopram (CELEXA) 40 MG tablet Take 40 mg by mouth at bedtime.      DICLOFENAC PO Take by mouth.     diclofenac sodium (VOLTAREN) 1 % GEL Apply to affected area 3-4 times daily PRN 3 Tube 3   hydrochlorothiazide (HYDRODIURIL) 25 MG tablet hydrochlorothiazide 25 mg tablet     lisinopril (PRINIVIL,ZESTRIL) 20 MG tablet Take 20 mg by mouth 2 (two) times daily.  magnesium oxide (MAG-OX) 400 MG tablet Take 400 mg by mouth daily.     meclizine (ANTIVERT) 25 MG tablet Take 12.5-25 mg by mouth daily as needed for dizziness.      Multiple Vitamins-Minerals (CENTRUM SILVER PO) Take 1 tablet by mouth daily.     Omega-3 Fatty Acids (FISH OIL PO) Take 2 capsules by mouth daily.     simvastatin (ZOCOR) 20 MG tablet Take 20 mg by mouth at bedtime.     No current facility-administered medications for this visit.     Past Surgical History:  Procedure Laterality Date   CARDIAC CATHETERIZATION     COLONOSCOPY  06/03/2012   Procedure: COLONOSCOPY;  Surgeon: Rogene Houston, MD;  Location: [facility];  Service: Endoscopy;  Laterality: N/A;  1200   COLONOSCOPY N/A 11/26/2017   Procedure: COLONOSCOPY;  Surgeon: Rogene Houston, MD;  Location: [facility];  Service: Endoscopy;  Laterality: N/A;  830   FACIAL COSMETIC SURGERY      KNEE ARTHROPLASTY     RHINOPLASTY     TOE SURGERY     TONSILLECTOMY     TOTAL KNEE ARTHROPLASTY Left 12/16/2016   Procedure: LEFT TOTAL KNEE ARTHROPLASTY;  Surgeon: Gaynelle Arabian, MD;  Location: [facility];  Service: Orthopedics;  Laterality: Left;  requests 32mns   TUBAL LIGATION       Allergies  Allergen Reactions   Ciprofloxacin Hcl Other (See Comments)    Hand stiffness    Clindamycin/Lincomycin Other (See Comments)    Per patient, give me c-diff   Flagyl [Metronidazole] Hives and Other (See Comments)    Questionable if this medication was the cause.   Gabapentin     Dry cough, hoarseness   Lodine [Etodolac] Other (See Comments)    Stomach pain    Other Other (See Comments)    Has a very low tolerance to sedation and stimulating drugs    Penicillins Diarrhea, Rash and Other (See Comments)    Has patient had a PCN reaction causing immediate rash, facial/tongue/throat swelling, SOB or lightheadedness with hypotension: unknown Has patient had a PCN reaction causing severe rash involving mucus membranes or skin necrosis: unknown Has patient had a PCN reaction that required hospitalization unknown Has patient had a PCN reaction occurring within the last 10 years: No If all of the above answers are "NO", then may proceed with Cephalosporin use.       Family History  Problem Relation Age of Onset   Colon cancer Mother    Heart Problems Father    COPD Sister    Hypertension Sister    Diabetes Sister    Thyroid disease Sister    Hypertension Sister    Healthy Son    Healthy Daughter      Social History Jane [name] reports that she has never smoked. She has never used smokeless tobacco. Ms. GSarvisreports current alcohol use.   Review of Systems CONSTITUTIONAL: No weight loss, fever, chills, weakness or fatigue.  HEENT: Eyes: No visual loss, blurred vision, double vision or yellow sclerae.No hearing loss, sneezing, congestion, runny nose or sore throat.  SKIN: No rash  or itching.  CARDIOVASCULAR: per hpi RESPIRATORY: No shortness of breath, cough or sputum.  GASTROINTESTINAL: No anorexia, nausea, vomiting or diarrhea. No abdominal pain or blood.  GENITOURINARY: No burning on urination, no polyuria NEUROLOGICAL: No headache, dizziness, syncope, paralysis, ataxia, numbness or tingling in the extremities. No change in bowel or bladder control.  MUSCULOSKELETAL: No muscle, back pain,  joint pain or stiffness.  LYMPHATICS: No enlarged nodes. No history of splenectomy.  PSYCHIATRIC: No history of depression or anxiety.  ENDOCRINOLOGIC: No reports of sweating, cold or heat intolerance. No polyuria or polydipsia.  Marland Kitchen   Physical Examination Today's Vitals   02/19/22 1057  BP: 118/70  Pulse: 70  Weight: 160 lb 3.2 oz (72.7 kg)  Height: '5\' 2"'$  (1.575 m)   Body mass index is 29.3 kg/m.  Gen: resting comfortably, no acute distress HEENT: no scleral icterus, pupils equal round and reactive, no palptable cervical adenopathy,  CV: RRR, no m/r/g, no jvd. Left carotid bruit.  Resp: Clear to auscultation bilaterally GI: abdomen is soft, non-tender, non-distended, normal bowel sounds, no hepatosplenomegaly MSK: extremities are warm, no edema.  Skin: warm, no rash Neuro:  no focal deficits Psych: appropriate affect   Diagnostic Studies  11/2016 echo [facility] LVEF 55-60%,   12/2016 Nuclear stress The study is normal. This is a low risk study. The left ventricular ejection fraction is normal (55-65%). There was no ST segment deviation noted during stress.   Normal resting and stress perfusion. No ischemia or infarction EF 54%      08/2020 echo IMPRESSIONS     1. Left ventricular ejection fraction, by estimation, is 60 to 65%. The  left ventricle has normal function. The left ventricle has no regional  wall motion abnormalities. Left ventricular diastolic parameters were  normal.   2. Right ventricular systolic function is normal. The right  ventricular  size is normal. There is normal pulmonary artery systolic pressure.   3. Left atrial size was mildly dilated.   4. The mitral valve is normal in structure. No evidence of mitral valve  regurgitation. No evidence of mitral stenosis.   5. The aortic valve is tricuspid. Aortic valve regurgitation is not  visualized. No aortic stenosis is present.   6. The inferior vena cava is normal in size with greater than 50%  respiratory variability, suggesting right atrial pressure of 3 mmHg.    Assessment and Plan  1.Chronic LBBB - no evidence of underlying significant heart disease by extensive workup  - no issues, continue to monitor    2. HTN - at goal, continue current meds  3. Carotid bruit - order carotid US        Arnoldo Lenis, M.D.

## 2022-02-19 NOTE — Patient Instructions (Signed)
Medication Instructions:  Your physician recommends that you continue on your current medications as directed. Please refer to the Current Medication list given to you today.   Labwork: none  Testing/Procedures: Your physician has requested that you have a carotid duplex. This test is an ultrasound of the carotid arteries in your neck. It looks at blood flow through these arteries that supply the brain with blood. Allow one hour for this exam. There are no restrictions or special instructions.   Follow-Up: Your physician recommends that you schedule a follow-up appointment in: 1 year  Any Other Special Instructions Will Be Listed Below (If Applicable).  You will receive a reminder call in about 10 months reminding you to schedule your appointment. If you don't receive this call, please contact our office.  If you need a refill on your cardiac medications before your next appointment, please call your pharmacy.

## 2022-03-18 ENCOUNTER — Ambulatory Visit: Payer: Medicare Other

## 2022-03-18 DIAGNOSIS — R0989 Other specified symptoms and signs involving the circulatory and respiratory systems: Secondary | ICD-10-CM

## 2022-04-02 DIAGNOSIS — Z0000 Encounter for general adult medical examination without abnormal findings: Secondary | ICD-10-CM | POA: Diagnosis not present

## 2022-04-02 DIAGNOSIS — I10 Essential (primary) hypertension: Secondary | ICD-10-CM | POA: Diagnosis not present

## 2022-04-02 DIAGNOSIS — Z7189 Other specified counseling: Secondary | ICD-10-CM | POA: Diagnosis not present

## 2022-04-02 DIAGNOSIS — Z299 Encounter for prophylactic measures, unspecified: Secondary | ICD-10-CM | POA: Diagnosis not present

## 2022-04-02 DIAGNOSIS — I739 Peripheral vascular disease, unspecified: Secondary | ICD-10-CM | POA: Diagnosis not present

## 2022-04-03 DIAGNOSIS — R5383 Other fatigue: Secondary | ICD-10-CM | POA: Diagnosis not present

## 2022-04-03 DIAGNOSIS — Z79899 Other long term (current) drug therapy: Secondary | ICD-10-CM | POA: Diagnosis not present

## 2022-04-03 DIAGNOSIS — E7800 Pure hypercholesterolemia, unspecified: Secondary | ICD-10-CM | POA: Diagnosis not present

## 2022-08-06 DIAGNOSIS — R079 Chest pain, unspecified: Secondary | ICD-10-CM | POA: Diagnosis not present

## 2022-08-08 DIAGNOSIS — J029 Acute pharyngitis, unspecified: Secondary | ICD-10-CM | POA: Diagnosis not present

## 2022-08-08 DIAGNOSIS — Z299 Encounter for prophylactic measures, unspecified: Secondary | ICD-10-CM | POA: Diagnosis not present

## 2022-08-08 DIAGNOSIS — J069 Acute upper respiratory infection, unspecified: Secondary | ICD-10-CM | POA: Diagnosis not present

## 2022-08-08 DIAGNOSIS — I10 Essential (primary) hypertension: Secondary | ICD-10-CM | POA: Diagnosis not present

## 2022-08-30 DIAGNOSIS — Z299 Encounter for prophylactic measures, unspecified: Secondary | ICD-10-CM | POA: Diagnosis not present

## 2022-08-30 DIAGNOSIS — I10 Essential (primary) hypertension: Secondary | ICD-10-CM | POA: Diagnosis not present

## 2022-08-30 DIAGNOSIS — S51009A Unspecified open wound of unspecified elbow, initial encounter: Secondary | ICD-10-CM | POA: Diagnosis not present

## 2022-09-12 DIAGNOSIS — R0789 Other chest pain: Secondary | ICD-10-CM | POA: Diagnosis not present

## 2022-09-12 DIAGNOSIS — M533 Sacrococcygeal disorders, not elsewhere classified: Secondary | ICD-10-CM | POA: Diagnosis not present

## 2022-09-12 DIAGNOSIS — I10 Essential (primary) hypertension: Secondary | ICD-10-CM | POA: Diagnosis not present

## 2022-09-12 DIAGNOSIS — Z713 Dietary counseling and surveillance: Secondary | ICD-10-CM | POA: Diagnosis not present

## 2022-09-12 DIAGNOSIS — Z299 Encounter for prophylactic measures, unspecified: Secondary | ICD-10-CM | POA: Diagnosis not present

## 2022-09-26 DIAGNOSIS — H40013 Open angle with borderline findings, low risk, bilateral: Secondary | ICD-10-CM | POA: Diagnosis not present

## 2022-10-28 ENCOUNTER — Encounter (INDEPENDENT_AMBULATORY_CARE_PROVIDER_SITE_OTHER): Payer: Self-pay | Admitting: *Deleted

## 2022-11-05 DIAGNOSIS — M25562 Pain in left knee: Secondary | ICD-10-CM | POA: Diagnosis not present

## 2022-11-25 DIAGNOSIS — Z1231 Encounter for screening mammogram for malignant neoplasm of breast: Secondary | ICD-10-CM | POA: Diagnosis not present

## 2023-01-20 DIAGNOSIS — Z299 Encounter for prophylactic measures, unspecified: Secondary | ICD-10-CM | POA: Diagnosis not present

## 2023-01-20 DIAGNOSIS — I10 Essential (primary) hypertension: Secondary | ICD-10-CM | POA: Diagnosis not present

## 2023-01-20 DIAGNOSIS — R42 Dizziness and giddiness: Secondary | ICD-10-CM | POA: Diagnosis not present

## 2023-02-08 DIAGNOSIS — S299XXA Unspecified injury of thorax, initial encounter: Secondary | ICD-10-CM | POA: Diagnosis not present

## 2023-02-08 DIAGNOSIS — R220 Localized swelling, mass and lump, head: Secondary | ICD-10-CM | POA: Diagnosis not present

## 2023-02-08 DIAGNOSIS — W19XXXA Unspecified fall, initial encounter: Secondary | ICD-10-CM | POA: Diagnosis not present

## 2023-02-08 DIAGNOSIS — I10 Essential (primary) hypertension: Secondary | ICD-10-CM | POA: Diagnosis not present

## 2023-02-08 DIAGNOSIS — S0993XA Unspecified injury of face, initial encounter: Secondary | ICD-10-CM | POA: Diagnosis not present

## 2023-02-08 DIAGNOSIS — S0181XA Laceration without foreign body of other part of head, initial encounter: Secondary | ICD-10-CM | POA: Diagnosis not present

## 2023-02-08 DIAGNOSIS — S0990XA Unspecified injury of head, initial encounter: Secondary | ICD-10-CM | POA: Diagnosis not present

## 2023-02-08 DIAGNOSIS — Z7982 Long term (current) use of aspirin: Secondary | ICD-10-CM | POA: Diagnosis not present

## 2023-02-08 DIAGNOSIS — M79642 Pain in left hand: Secondary | ICD-10-CM | POA: Diagnosis not present

## 2023-02-08 DIAGNOSIS — Z888 Allergy status to other drugs, medicaments and biological substances status: Secondary | ICD-10-CM | POA: Diagnosis not present

## 2023-02-08 DIAGNOSIS — Z881 Allergy status to other antibiotic agents status: Secondary | ICD-10-CM | POA: Diagnosis not present

## 2023-02-08 DIAGNOSIS — Z880 Allergy status to penicillin: Secondary | ICD-10-CM | POA: Diagnosis not present

## 2023-02-08 DIAGNOSIS — R58 Hemorrhage, not elsewhere classified: Secondary | ICD-10-CM | POA: Diagnosis not present

## 2023-02-08 DIAGNOSIS — S0083XA Contusion of other part of head, initial encounter: Secondary | ICD-10-CM | POA: Diagnosis not present

## 2023-02-08 DIAGNOSIS — Z79899 Other long term (current) drug therapy: Secondary | ICD-10-CM | POA: Diagnosis not present

## 2023-02-08 DIAGNOSIS — M542 Cervicalgia: Secondary | ICD-10-CM | POA: Diagnosis not present

## 2023-02-13 ENCOUNTER — Telehealth (INDEPENDENT_AMBULATORY_CARE_PROVIDER_SITE_OTHER): Payer: Self-pay | Admitting: Gastroenterology

## 2023-02-13 NOTE — Telephone Encounter (Signed)
Who is your primary care physician: Dr.Dhruv Vyas  Reasons for the colonoscopy: Recall  Have you had a colonoscopy before?  Yes 5 years ago  Do you have family history of colon cancer? Yes mother. She was her in 70s-surgery and no other treatment  Previous colonoscopy with polyps removed? Yes 10 years ago?  Do you have a history colorectal cancer?   no  Are you diabetic? If yes, Type 1 or Type 2?    no  Do you have a prosthetic or mechanical heart valve? yes  Do you have a pacemaker/defibrillator?   no  Have you had endocarditis/atrial fibrillation? no  Have you had joint replacement within the last 12 months?  no  Do you tend to be constipated or have to use laxatives? Yes do not use laxatives often-water and fruit usually solve the problem and metamucil  Do you have any history of drugs or alchohol?  no  Do you use supplemental oxygen?  no  Have you had a stroke or heart attack within the last 6 months? no  Do you take weight loss medication?  no  For female patients: have you had a hysterectomy?  no                                     are you post menopausal?       yes                                            do you still have your menstrual cycle? no      Do you take any blood-thinning medications such as: (aspirin, warfarin, Plavix, Aggrenox)  yes  If yes we need the name, milligram, dosage and who is prescribing doctor Aspirin 81 mg daily Current Outpatient Medications on File Prior to Visit  Medication Sig Dispense Refill   acetaminophen (TYLENOL) 650 MG CR tablet Take 325 mg by mouth daily.     ALPRAZolam (XANAX) 0.5 MG tablet Take 0.5 mg by mouth 2 (two) times daily as needed.     aspirin EC 81 MG tablet Take 81 mg by mouth at bedtime.     Calcium Carbonate-Vitamin D (CALTRATE 600+D PO) Take 1 tablet by mouth 2 (two) times daily.     Cholecalciferol (VITAMIN D3) 2000 units TABS Take 2,000 Units by mouth daily.     citalopram (CELEXA) 40 MG tablet Take 40 mg by  mouth at bedtime.      DICLOFENAC PO Take by mouth.     diclofenac sodium (VOLTAREN) 1 % GEL Apply to affected area 3-4 times daily PRN 3 Tube 3   hydrochlorothiazide (HYDRODIURIL) 25 MG tablet hydrochlorothiazide 25 mg tablet     lisinopril (PRINIVIL,ZESTRIL) 20 MG tablet Take 20 mg by mouth 2 (two) times daily.     magnesium oxide (MAG-OX) 400 MG tablet Take 400 mg by mouth daily.     meclizine (ANTIVERT) 25 MG tablet Take 12.5-25 mg by mouth daily as needed for dizziness.      Multiple Vitamins-Minerals (CENTRUM SILVER PO) Take 1 tablet by mouth daily.     Omega-3 Fatty Acids (FISH OIL PO) Take 2 capsules by mouth daily.     simvastatin (ZOCOR) 20 MG tablet Take 20 mg by mouth at bedtime.  No current facility-administered medications on file prior to visit.    Allergies  Allergen Reactions   Ciprofloxacin Hcl Other (See Comments)    Hand stiffness    Clindamycin/Lincomycin Other (See Comments)    Per patient, give me c-diff   Flagyl [Metronidazole] Hives and Other (See Comments)    Questionable if this medication was the cause.   Gabapentin     Dry cough, hoarseness   Lodine [Etodolac] Other (See Comments)    Stomach pain    Other Other (See Comments)    Has a very low tolerance to sedation and stimulating drugs    Penicillins Diarrhea, Rash and Other (See Comments)    Has patient had a PCN reaction causing immediate rash, facial/tongue/throat swelling, SOB or lightheadedness with hypotension: unknown Has patient had a PCN reaction causing severe rash involving mucus membranes or skin necrosis: unknown Has patient had a PCN reaction that required hospitalization unknown Has patient had a PCN reaction occurring within the last 10 years: No If all of the above answers are "NO", then may proceed with Cephalosporin use.      Pharmacy: [facility]  Primary Insurance Name: [facility] Advantage from [facility]  Best number where you can be reached: [phone]  landline  I am 82 years old. I do have some fear of a colonoscopy at my age. Dr.Vyas told me ask your practice if it is recommended for me at this age, based on past colonoscopy. Thank you! Jane [name] 01/24/23

## 2023-02-17 DIAGNOSIS — Z299 Encounter for prophylactic measures, unspecified: Secondary | ICD-10-CM | POA: Diagnosis not present

## 2023-02-17 DIAGNOSIS — E871 Hypo-osmolality and hyponatremia: Secondary | ICD-10-CM | POA: Diagnosis not present

## 2023-02-17 DIAGNOSIS — I10 Essential (primary) hypertension: Secondary | ICD-10-CM | POA: Diagnosis not present

## 2023-02-17 DIAGNOSIS — D649 Anemia, unspecified: Secondary | ICD-10-CM | POA: Diagnosis not present

## 2023-02-17 DIAGNOSIS — R296 Repeated falls: Secondary | ICD-10-CM | POA: Diagnosis not present

## 2023-02-17 NOTE — Telephone Encounter (Signed)
Room 3  Thanks 

## 2023-02-18 NOTE — Telephone Encounter (Signed)
Based on the recommendations from her previous colonoscopy, she will need to have a repeat colonoscopy given the fact she had a family member (mother) with history of colon cancer.  Please let her know that it would be our recommendation to proceed with this colonoscopy nonurgently.

## 2023-02-18 NOTE — Telephone Encounter (Signed)
Pt contacted. Pt states here provider wanted her to ask [place] why we think it is necessary to have a TCS at pt age.  Pt states she had a traumatic fall and would like a call back next week with recommendation.  Please advise. Thank you.

## 2023-02-25 NOTE — Telephone Encounter (Signed)
Attempted to reach pt-no answering service available

## 2023-02-26 NOTE — Telephone Encounter (Signed)
Pt contacted. Pt verbalized understanding. Pt has a traumatic brain injury and will be seeing Dr.Vyas in the next week. She states she will speak with him and she will give [place] a call to schedule. Pt would like to that Dr.Castaneda for his informed answer.

## 2023-02-27 ENCOUNTER — Telehealth: Payer: Self-pay | Admitting: Cardiology

## 2023-02-27 NOTE — Telephone Encounter (Signed)
Pt c/o medication issue:  1. Name of Medication:  hydrochlorothiazide (HYDRODIURIL) 25 MG tablet   2. How are you currently taking this medication (dosage and times per day)? As prescribed   3. Are you having a reaction (difficulty breathing--STAT)? No   4. What is your medication issue? On 06/01 Sodium was 134 10 days later it was 128. Thinks it may be due to this medication as well as a antidepressant she is on.

## 2023-02-27 NOTE — Telephone Encounter (Signed)
Patient states that Dr. Sherril Croon has been checking her labs recently & noticed that her Sodium was low.  She states that she will be seeing pcp back again on 03/05/23 for another check.  Also states that Dr. Sherril Croon was the provider that prescribed her the hydrochlorothiazide.  Suggested that she call them back to discuss further concerns in regards to the low sodium as he is currently treating her for this.  She verbalized understanding.

## 2023-03-05 DIAGNOSIS — I10 Essential (primary) hypertension: Secondary | ICD-10-CM | POA: Diagnosis not present

## 2023-03-05 DIAGNOSIS — Z299 Encounter for prophylactic measures, unspecified: Secondary | ICD-10-CM | POA: Diagnosis not present

## 2023-03-05 DIAGNOSIS — E871 Hypo-osmolality and hyponatremia: Secondary | ICD-10-CM | POA: Diagnosis not present

## 2023-03-05 DIAGNOSIS — R519 Headache, unspecified: Secondary | ICD-10-CM | POA: Diagnosis not present

## 2023-03-07 ENCOUNTER — Other Ambulatory Visit (HOSPITAL_COMMUNITY): Payer: Self-pay | Admitting: Internal Medicine

## 2023-03-07 ENCOUNTER — Encounter (HOSPITAL_COMMUNITY): Payer: Self-pay | Admitting: Internal Medicine

## 2023-03-07 DIAGNOSIS — R42 Dizziness and giddiness: Secondary | ICD-10-CM

## 2023-03-10 ENCOUNTER — Ambulatory Visit (HOSPITAL_COMMUNITY)
Admission: RE | Admit: 2023-03-10 | Discharge: 2023-03-10 | Disposition: A | Discharge status: Home / Self Care | Payer: Medicare Other | Source: Ambulatory Visit | Attending: Internal Medicine | Admitting: Internal Medicine

## 2023-03-10 DIAGNOSIS — S0990XD Unspecified injury of head, subsequent encounter: Secondary | ICD-10-CM | POA: Diagnosis not present

## 2023-03-10 DIAGNOSIS — R42 Dizziness and giddiness: Secondary | ICD-10-CM | POA: Insufficient documentation

## 2023-03-14 DIAGNOSIS — E871 Hypo-osmolality and hyponatremia: Secondary | ICD-10-CM | POA: Diagnosis not present

## 2023-03-17 DIAGNOSIS — E871 Hypo-osmolality and hyponatremia: Secondary | ICD-10-CM | POA: Diagnosis not present

## 2023-03-17 DIAGNOSIS — N182 Chronic kidney disease, stage 2 (mild): Secondary | ICD-10-CM | POA: Diagnosis not present

## 2023-03-17 DIAGNOSIS — F32A Depression, unspecified: Secondary | ICD-10-CM | POA: Diagnosis not present

## 2023-03-17 DIAGNOSIS — I10 Essential (primary) hypertension: Secondary | ICD-10-CM | POA: Diagnosis not present

## 2023-03-19 DIAGNOSIS — I10 Essential (primary) hypertension: Secondary | ICD-10-CM | POA: Diagnosis not present

## 2023-03-19 DIAGNOSIS — I739 Peripheral vascular disease, unspecified: Secondary | ICD-10-CM | POA: Diagnosis not present

## 2023-03-19 DIAGNOSIS — E222 Syndrome of inappropriate secretion of antidiuretic hormone: Secondary | ICD-10-CM | POA: Diagnosis not present

## 2023-03-19 DIAGNOSIS — Z299 Encounter for prophylactic measures, unspecified: Secondary | ICD-10-CM | POA: Diagnosis not present

## 2023-03-31 ENCOUNTER — Ambulatory Visit: Payer: Medicare Other | Attending: Nurse Practitioner | Admitting: Nurse Practitioner

## 2023-03-31 ENCOUNTER — Encounter: Payer: Self-pay | Admitting: Nurse Practitioner

## 2023-03-31 VITALS — BP 137/82 | HR 81 | Ht 61.5 in | Wt 160.6 lb

## 2023-03-31 DIAGNOSIS — R296 Repeated falls: Secondary | ICD-10-CM

## 2023-03-31 DIAGNOSIS — I10 Essential (primary) hypertension: Secondary | ICD-10-CM

## 2023-03-31 DIAGNOSIS — S069XAD Unspecified intracranial injury with loss of consciousness status unknown, subsequent encounter: Secondary | ICD-10-CM

## 2023-03-31 DIAGNOSIS — E785 Hyperlipidemia, unspecified: Secondary | ICD-10-CM

## 2023-03-31 NOTE — Progress Notes (Unsigned)
Cardiology Office Note:  .   Date:  03/31/2023 ID:  Jane [name], DOB 1941-08-30, MRN [id number] PCP: Ignatius Specking, MD  [facility] Providers Cardiologist:  Dina Rich, MD { History of Present Illness: .   Jane [name] is a 82 y.o. female with a PMH of HTN, valvular heart disease, chronic LBBB, and HLD, who presents today for 1 year follow-up.   Last seen by Dr. Dina Rich on February 19, 2022.  Carotid bruit was noted on exam.  Carotid Doppler ultrasound was benign.  Today she presents for 1 year follow-up.  She states about 6-7 weeks ago, she suffered a serious falls, and had a TBI, says she was evaluated by EMS and sent to ED in [place]. Has hx of multiple falls. Says she is now following Dr. Wolfgang Phoenix for past hx of sodium level of 124. Overall doing well from a cardiac perspective. Denies any chest pain, shortness of breath, palpitations, syncope, presyncope, dizziness, orthopnea, PND, swelling or significant weight changes, acute bleeding, or claudication.  Studies Reviewed: Marland Kitchen    EKG:  EKG Interpretation Date/Time:  Monday March 31 2023 15:14:43 EDT Ventricular Rate:  77 PR Interval:  156 QRS Duration:  124 QT Interval:  414 QTC Calculation: 468 R Axis:   70  Text Interpretation: Normal sinus rhythm Left bundle branch block When compared with ECG of 02-Oct-2017 03:23, PREVIOUS ECG IS PRESENT Confirmed by Sharlene Dory [phone]) on 03/31/2023 3:20:51 PM   Echo 08/2020:  1. Left ventricular ejection fraction, by estimation, is 60 to 65%. The  left ventricle has normal function. The left ventricle has no regional  wall motion abnormalities. Left ventricular diastolic parameters were  normal.   2. Right ventricular systolic function is normal. The right ventricular  size is normal. There is normal pulmonary artery systolic pressure.   3. Left atrial size was mildly dilated.   4. The mitral valve is normal in structure. No evidence of mitral valve  regurgitation.  No evidence of mitral stenosis.   5. The aortic valve is tricuspid. Aortic valve regurgitation is not  visualized. No aortic stenosis is present.   6. The inferior vena cava is normal in size with greater than 50%  respiratory variability, suggesting right atrial pressure of 3 mmHg.  Lexiscan 12/2016:  The study is normal. This is a low risk study. The left ventricular ejection fraction is normal (55-65%). There was no ST segment deviation noted during stress.   Normal resting and stress perfusion. No ischemia or infarction EF 54%      Physical Exam:   VS:  BP 137/82 (BP Location: Left Arm, Patient Position: Sitting, Cuff Size: Normal)   Pulse 81   Ht 5' 1.5" (1.562 m)   Wt 160 lb 9.6 oz (72.8 kg)   SpO2 97%   BMI 29.85 kg/m    Wt Readings from Last 3 Encounters:  03/31/23 160 lb 9.6 oz (72.8 kg)  02/19/22 160 lb 3.2 oz (72.7 kg)  02/19/21 168 lb (76.2 kg)    GEN: Well nourished, well developed in no acute distress NECK: No JVD; No carotid bruits CARDIAC: S1/S2, RRR, no murmurs, rubs, gallops RESPIRATORY:  Clear to auscultation without rales, wheezing or rhonchi  ABDOMEN: Soft, non-tender, non-distended EXTREMITIES:  No edema; No deformity   ASSESSMENT AND PLAN: .    HTN BP elevated on arrival, repeat BP at goal. Continue current medication regimen. Discussed to monitor BP at home at least 2 hours after medications and  sitting for 5-10 minutes. Will request most recent labs from Dr. Bonnita Levan office. Heart healthy diet encouraged.   HLD Will request most recent labs from PCP's office. Continue simvastatin. Heart healthy diet encouraged.   Falls, hx of TBI   Admits to multiple falls with fall 6-7 weeks ago resulting in TBI, will request records from [place]. She has fully recovered any denies any deficits. Fall precautions discussed. Continue to follow with PCP.  Dispo: Follow-up with me or [facility] in 4-6 months or sooner if anything changes.   Signed, Sharlene Dory, NP

## 2023-03-31 NOTE — Patient Instructions (Addendum)
Medication Instructions:  Your physician recommends that you continue on your current medications as directed. Please refer to the Current Medication list given to you today.  Labwork: none  Testing/Procedures: none  Follow-Up: Your physician recommends that you schedule a follow-up appointment in: 4-6 Months with Philis Nettle  Any Other Special Instructions Will Be Listed Below (If Applicable).  If you need a refill on your cardiac medications before your next appointment, please call your pharmacy.

## 2023-04-01 ENCOUNTER — Encounter: Payer: Self-pay | Admitting: Internal Medicine

## 2023-04-01 ENCOUNTER — Encounter: Payer: Self-pay | Admitting: Nurse Practitioner

## 2023-04-08 DIAGNOSIS — I10 Essential (primary) hypertension: Secondary | ICD-10-CM | POA: Diagnosis not present

## 2023-04-08 DIAGNOSIS — Z7189 Other specified counseling: Secondary | ICD-10-CM | POA: Diagnosis not present

## 2023-04-08 DIAGNOSIS — I739 Peripheral vascular disease, unspecified: Secondary | ICD-10-CM | POA: Diagnosis not present

## 2023-04-08 DIAGNOSIS — Z0000 Encounter for general adult medical examination without abnormal findings: Secondary | ICD-10-CM | POA: Diagnosis not present

## 2023-04-08 DIAGNOSIS — Z299 Encounter for prophylactic measures, unspecified: Secondary | ICD-10-CM | POA: Diagnosis not present

## 2023-04-15 DIAGNOSIS — E871 Hypo-osmolality and hyponatremia: Secondary | ICD-10-CM | POA: Diagnosis not present

## 2023-04-15 DIAGNOSIS — I10 Essential (primary) hypertension: Secondary | ICD-10-CM | POA: Diagnosis not present

## 2023-04-15 DIAGNOSIS — F32A Depression, unspecified: Secondary | ICD-10-CM | POA: Diagnosis not present

## 2023-04-15 DIAGNOSIS — N182 Chronic kidney disease, stage 2 (mild): Secondary | ICD-10-CM | POA: Diagnosis not present

## 2023-04-30 DIAGNOSIS — F32A Depression, unspecified: Secondary | ICD-10-CM | POA: Diagnosis not present

## 2023-04-30 DIAGNOSIS — N182 Chronic kidney disease, stage 2 (mild): Secondary | ICD-10-CM | POA: Diagnosis not present

## 2023-04-30 DIAGNOSIS — I10 Essential (primary) hypertension: Secondary | ICD-10-CM | POA: Diagnosis not present

## 2023-04-30 DIAGNOSIS — E871 Hypo-osmolality and hyponatremia: Secondary | ICD-10-CM | POA: Diagnosis not present

## 2023-05-13 ENCOUNTER — Other Ambulatory Visit (HOSPITAL_COMMUNITY): Payer: Self-pay | Admitting: Nephrology

## 2023-05-13 DIAGNOSIS — R5383 Other fatigue: Secondary | ICD-10-CM | POA: Diagnosis not present

## 2023-05-13 DIAGNOSIS — Z79899 Other long term (current) drug therapy: Secondary | ICD-10-CM | POA: Diagnosis not present

## 2023-05-13 DIAGNOSIS — E7800 Pure hypercholesterolemia, unspecified: Secondary | ICD-10-CM | POA: Diagnosis not present

## 2023-05-13 DIAGNOSIS — Z122 Encounter for screening for malignant neoplasm of respiratory organs: Secondary | ICD-10-CM

## 2023-05-14 DIAGNOSIS — Z0000 Encounter for general adult medical examination without abnormal findings: Secondary | ICD-10-CM | POA: Diagnosis not present

## 2023-05-14 DIAGNOSIS — I10 Essential (primary) hypertension: Secondary | ICD-10-CM | POA: Diagnosis not present

## 2023-05-14 DIAGNOSIS — I739 Peripheral vascular disease, unspecified: Secondary | ICD-10-CM | POA: Diagnosis not present

## 2023-05-14 DIAGNOSIS — Z299 Encounter for prophylactic measures, unspecified: Secondary | ICD-10-CM | POA: Diagnosis not present

## 2023-05-15 DIAGNOSIS — Z299 Encounter for prophylactic measures, unspecified: Secondary | ICD-10-CM | POA: Diagnosis not present

## 2023-05-15 DIAGNOSIS — R059 Cough, unspecified: Secondary | ICD-10-CM | POA: Diagnosis not present

## 2023-05-15 DIAGNOSIS — R0981 Nasal congestion: Secondary | ICD-10-CM | POA: Diagnosis not present

## 2023-05-15 DIAGNOSIS — J069 Acute upper respiratory infection, unspecified: Secondary | ICD-10-CM | POA: Diagnosis not present

## 2023-07-07 DIAGNOSIS — I739 Peripheral vascular disease, unspecified: Secondary | ICD-10-CM | POA: Diagnosis not present

## 2023-07-07 DIAGNOSIS — Z299 Encounter for prophylactic measures, unspecified: Secondary | ICD-10-CM | POA: Diagnosis not present

## 2023-07-07 DIAGNOSIS — D692 Other nonthrombocytopenic purpura: Secondary | ICD-10-CM | POA: Diagnosis not present

## 2023-07-07 DIAGNOSIS — I10 Essential (primary) hypertension: Secondary | ICD-10-CM | POA: Diagnosis not present

## 2023-07-07 DIAGNOSIS — E222 Syndrome of inappropriate secretion of antidiuretic hormone: Secondary | ICD-10-CM | POA: Diagnosis not present

## 2023-08-01 ENCOUNTER — Ambulatory Visit: Payer: Medicare Other | Attending: Nurse Practitioner | Admitting: Nurse Practitioner

## 2023-08-01 ENCOUNTER — Encounter: Payer: Self-pay | Admitting: Nurse Practitioner

## 2023-08-01 VITALS — BP 134/82 | HR 88 | Ht 62.0 in | Wt 165.6 lb

## 2023-08-01 DIAGNOSIS — R079 Chest pain, unspecified: Secondary | ICD-10-CM

## 2023-08-01 DIAGNOSIS — S069XAD Unspecified intracranial injury with loss of consciousness status unknown, subsequent encounter: Secondary | ICD-10-CM

## 2023-08-01 DIAGNOSIS — I10 Essential (primary) hypertension: Secondary | ICD-10-CM

## 2023-08-01 DIAGNOSIS — E785 Hyperlipidemia, unspecified: Secondary | ICD-10-CM | POA: Diagnosis not present

## 2023-08-01 NOTE — Progress Notes (Unsigned)
Cardiology Office Note:  .   Date:  03/31/2023 ID:  Jane [name], DOB 1941-09-01, MRN [id number] PCP: Ignatius Specking, MD  [facility] Providers Cardiologist:  Dina Rich, MD { History of Present Illness: .   Jane [name] is a 82 y.o. female with a PMH of HTN, valvular heart disease, chronic LBBB, and HLD, who presents today for 1 year follow-up.   Last seen by Dr. Dina Rich on February 19, 2022.  Carotid bruit was noted on exam.  Carotid Doppler ultrasound was benign.  Today she presents for 1 year follow-up.  She states about 6-7 weeks ago, she suffered a serious falls, and had a TBI, says she was evaluated by EMS and sent to ED in [place]. Has hx of multiple falls. Says she is now following Dr. Wolfgang Phoenix for past hx of sodium level of 124. Overall doing well from a cardiac perspective. Denies any chest pain, shortness of breath, palpitations, syncope, presyncope, dizziness, orthopnea, PND, swelling or significant weight changes, acute bleeding, or claudication.    Panic attack - called EMS.    She presents today for follow-up.  Has been having intermittent dull left-sided chest pain since her TBI.  No specific triggers.  Noticed more at night while she is sitting and watching TV.  Denies any radiation, says this is mild in intensity.  Symptoms have been stable over time.  This morning, she called EMS and they arrived at her house for evaluation.  Says when awakening at about 7:00 this morning to use the bathroom she felt dizzy/similar to when she has vertigo.  Then she felt a heat along mid/central part of her chest.  This concerned her so she called EMS.  Says EKG was obtained by EMS and revealed tachycardia, says she is not sure how fast her heart rate went.  Denies any other associated symptoms.    Studies Reviewed: Marland Kitchen    EKG:      Echo 08/2020:  1. Left ventricular ejection fraction, by estimation, is 60 to 65%. The  left ventricle has normal function. The left  ventricle has no regional  wall motion abnormalities. Left ventricular diastolic parameters were  normal.   2. Right ventricular systolic function is normal. The right ventricular  size is normal. There is normal pulmonary artery systolic pressure.   3. Left atrial size was mildly dilated.   4. The mitral valve is normal in structure. No evidence of mitral valve  regurgitation. No evidence of mitral stenosis.   5. The aortic valve is tricuspid. Aortic valve regurgitation is not  visualized. No aortic stenosis is present.   6. The inferior vena cava is normal in size with greater than 50%  respiratory variability, suggesting right atrial pressure of 3 mmHg.  Lexiscan 12/2016:  The study is normal. This is a low risk study. The left ventricular ejection fraction is normal (55-65%). There was no ST segment deviation noted during stress.   Normal resting and stress perfusion. No ischemia or infarction EF 54%      Physical Exam:   VS:  There were no vitals taken for this visit.   Wt Readings from Last 3 Encounters:  03/31/23 160 lb 9.6 oz (72.8 kg)  02/19/22 160 lb 3.2 oz (72.7 kg)  02/19/21 168 lb (76.2 kg)    GEN: Well nourished, well developed in no acute distress NECK: No JVD; No carotid bruits CARDIAC: S1/S2, RRR, no murmurs, rubs, gallops RESPIRATORY:  Clear to auscultation without rales,  wheezing or rhonchi  ABDOMEN: Soft, non-tender, non-distended EXTREMITIES:  No edema; No deformity   ASSESSMENT AND PLAN: .    HTN BP elevated on arrival, repeat BP at goal. Continue current medication regimen. Discussed to monitor BP at home at least 2 hours after medications and sitting for 5-10 minutes. Will request most recent labs from Dr. Bonnita Levan office. Heart healthy diet encouraged.   HLD Will request most recent labs from PCP's office. Continue simvastatin. Heart healthy diet encouraged.   Falls, hx of TBI   Admits to multiple falls with fall 6-7 weeks ago resulting in TBI, will  request records from [place]. She has fully recovered any denies any deficits. Fall precautions discussed. Continue to follow with PCP.  Dispo: Follow-up with me or APP in 4-6 months or sooner if anything changes.   Signed, Sharlene Dory, NP

## 2023-08-01 NOTE — Patient Instructions (Signed)

## 2023-08-10 DIAGNOSIS — M810 Age-related osteoporosis without current pathological fracture: Secondary | ICD-10-CM

## 2023-08-10 HISTORY — DX: Age-related osteoporosis without current pathological fracture: M81.0

## 2023-08-26 DIAGNOSIS — M810 Age-related osteoporosis without current pathological fracture: Secondary | ICD-10-CM | POA: Diagnosis not present

## 2023-08-26 DIAGNOSIS — R296 Repeated falls: Secondary | ICD-10-CM | POA: Diagnosis not present

## 2023-08-26 DIAGNOSIS — I10 Essential (primary) hypertension: Secondary | ICD-10-CM | POA: Diagnosis not present

## 2023-08-26 DIAGNOSIS — Z299 Encounter for prophylactic measures, unspecified: Secondary | ICD-10-CM | POA: Diagnosis not present

## 2023-09-30 DIAGNOSIS — H40013 Open angle with borderline findings, low risk, bilateral: Secondary | ICD-10-CM | POA: Diagnosis not present

## 2023-10-06 ENCOUNTER — Ambulatory Visit (HOSPITAL_COMMUNITY)
Admission: RE | Admit: 2023-10-06 | Discharge: 2023-10-06 | Disposition: A | Discharge status: Home / Self Care | Payer: Medicare Other | Source: Ambulatory Visit | Attending: Nephrology | Admitting: Nephrology

## 2023-10-06 DIAGNOSIS — N189 Chronic kidney disease, unspecified: Secondary | ICD-10-CM | POA: Diagnosis not present

## 2023-10-06 DIAGNOSIS — N182 Chronic kidney disease, stage 2 (mild): Secondary | ICD-10-CM | POA: Diagnosis not present

## 2023-10-06 DIAGNOSIS — I10 Essential (primary) hypertension: Secondary | ICD-10-CM | POA: Diagnosis not present

## 2023-10-06 DIAGNOSIS — Z122 Encounter for screening for malignant neoplasm of respiratory organs: Secondary | ICD-10-CM | POA: Diagnosis not present

## 2023-10-06 DIAGNOSIS — E871 Hypo-osmolality and hyponatremia: Secondary | ICD-10-CM | POA: Diagnosis not present

## 2023-10-09 DIAGNOSIS — M810 Age-related osteoporosis without current pathological fracture: Secondary | ICD-10-CM | POA: Diagnosis not present

## 2023-10-09 DIAGNOSIS — M19049 Primary osteoarthritis, unspecified hand: Secondary | ICD-10-CM | POA: Diagnosis not present

## 2023-10-09 DIAGNOSIS — Z299 Encounter for prophylactic measures, unspecified: Secondary | ICD-10-CM | POA: Diagnosis not present

## 2023-10-09 DIAGNOSIS — I10 Essential (primary) hypertension: Secondary | ICD-10-CM | POA: Diagnosis not present

## 2023-10-13 DIAGNOSIS — E871 Hypo-osmolality and hyponatremia: Secondary | ICD-10-CM | POA: Diagnosis not present

## 2023-10-13 DIAGNOSIS — N182 Chronic kidney disease, stage 2 (mild): Secondary | ICD-10-CM | POA: Diagnosis not present

## 2023-10-13 DIAGNOSIS — I10 Essential (primary) hypertension: Secondary | ICD-10-CM | POA: Diagnosis not present

## 2023-11-06 ENCOUNTER — Encounter: Payer: Self-pay | Admitting: Nurse Practitioner

## 2023-11-06 ENCOUNTER — Ambulatory Visit: Payer: Medicare Other | Attending: Nurse Practitioner | Admitting: Nurse Practitioner

## 2023-11-06 VITALS — BP 122/68 | HR 93 | Ht 62.0 in | Wt 165.4 lb

## 2023-11-06 DIAGNOSIS — E785 Hyperlipidemia, unspecified: Secondary | ICD-10-CM

## 2023-11-06 DIAGNOSIS — Z9181 History of falling: Secondary | ICD-10-CM

## 2023-11-06 DIAGNOSIS — I10 Essential (primary) hypertension: Secondary | ICD-10-CM | POA: Diagnosis not present

## 2023-11-06 DIAGNOSIS — S069XAD Unspecified intracranial injury with loss of consciousness status unknown, subsequent encounter: Secondary | ICD-10-CM

## 2023-11-06 NOTE — Progress Notes (Signed)
 Cardiology Office Note:  .   Date:  11/06/2023 ID:  Jane [name], DOB [birth year], MRN [id number] PCP: Ignatius Specking, MD  [facility] Providers Cardiologist:  Dina Rich, MD { History of Present Illness: .   Jane [name] is a 83 y.o. female with a PMH of HTN, chronic LBBB, and HLD, who presents today for  scheduled follow-up.   Last seen by Dr. Dina Rich on February 19, 2022.  Carotid bruit was noted on exam.  Carotid Doppler ultrasound was benign.  08/01/2023 -  She presents today for follow-up.  Has been having intermittent dull left-sided chest pain since her TBI.  No specific triggers.  Noticed more at night while she is sitting and watching TV.  Denies any radiation, says this is mild in intensity.  Symptoms have been stable over time.  This morning, she called EMS and they arrived at her house for evaluation.  Says when awakening at about 7:00 this morning to use the bathroom she felt dizzy/similar to when she has vertigo.  Then she felt a heat along mid/central part of her chest.  This concerned her so she called EMS.  Says EKG was obtained by EMS and revealed tachycardia, says she is not sure how fast her heart rate went.  Denies any other associated symptoms. Denies any shortness of breath, palpitations, syncope, presyncope, orthopnea, PND, swelling or significant weight changes, acute bleeding, or claudication.  11/06/2023 -presented today for follow-up.  Doing well from a cardiac perspective.  Denies any chest pain, shortness of breath, palpitations, syncope, presyncope, dizziness, orthopnea, PND, swelling or significant weight changes, acute bleeding, or claudication. Does note some anxiety d/t some age-related changes and d/t her fall last year. Denies any recurrent falls.    Studies Reviewed: Marland Kitchen    EKG: EKG is not ordered today.      Echo 08/2020:  1. Left ventricular ejection fraction, by estimation, is 60 to 65%. The  left ventricle has normal function. The left  ventricle has no regional  wall motion abnormalities. Left ventricular diastolic parameters were  normal.   2. Right ventricular systolic function is normal. The right ventricular  size is normal. There is normal pulmonary artery systolic pressure.   3. Left atrial size was mildly dilated.   4. The mitral valve is normal in structure. No evidence of mitral valve  regurgitation. No evidence of mitral stenosis.   5. The aortic valve is tricuspid. Aortic valve regurgitation is not  visualized. No aortic stenosis is present.   6. The inferior vena cava is normal in size with greater than 50%  respiratory variability, suggesting right atrial pressure of 3 mmHg.  Lexiscan 12/2016:  The study is normal. This is a low risk study. The left ventricular ejection fraction is normal (55-65%). There was no ST segment deviation noted during stress.   Normal resting and stress perfusion. No ischemia or infarction EF 54%      Physical Exam:   VS:  BP 122/68   Pulse 93   Ht 5\' 2"  (1.575 m)   Wt 165 lb 6.4 oz (75 kg)   SpO2 98%   BMI 30.25 kg/m    Wt Readings from Last 3 Encounters:  11/06/23 165 lb 6.4 oz (75 kg)  08/01/23 165 lb 9.6 oz (75.1 kg)  03/31/23 160 lb 9.6 oz (72.8 kg)    GEN: Well nourished, well developed in no acute distress NECK: No JVD; No carotid bruits CARDIAC: S1/S2, RRR, no murmurs,  rubs, gallops RESPIRATORY:  Clear to auscultation without rales, wheezing or rhonchi  ABDOMEN: Soft, non-tender, non-distended EXTREMITIES:  No edema; No deformity   ASSESSMENT AND PLAN: .    HTN BP stable. Continue current medication regimen. Discussed to monitor BP at home at least 2 hours after medications and sitting for 5-10 minutes. Heart healthy diet encouraged.   HLD LDL 101 05/2023. Being managed by PCP. Continue simvastatin. Heart healthy diet encouraged. Continue to follow with PCP.  Hx of falls, hx of TBI Denies any recent falls. Denies any deficits since TBI. Fall  precautions discussed. Continue to follow with PCP. Care and ED precautions discussed.   Dispo: Follow-up with Dr. Wyline Mood or APP in 6 months or sooner if anything changes.   Signed, Sharlene Dory, NP

## 2023-11-06 NOTE — Patient Instructions (Addendum)

## 2023-11-17 DIAGNOSIS — Z299 Encounter for prophylactic measures, unspecified: Secondary | ICD-10-CM | POA: Diagnosis not present

## 2023-11-17 DIAGNOSIS — I10 Essential (primary) hypertension: Secondary | ICD-10-CM | POA: Diagnosis not present

## 2023-11-17 DIAGNOSIS — E222 Syndrome of inappropriate secretion of antidiuretic hormone: Secondary | ICD-10-CM | POA: Diagnosis not present

## 2023-11-17 DIAGNOSIS — M159 Polyosteoarthritis, unspecified: Secondary | ICD-10-CM | POA: Diagnosis not present

## 2023-11-17 DIAGNOSIS — I739 Peripheral vascular disease, unspecified: Secondary | ICD-10-CM | POA: Diagnosis not present

## 2023-11-27 DIAGNOSIS — Z1231 Encounter for screening mammogram for malignant neoplasm of breast: Secondary | ICD-10-CM | POA: Diagnosis not present

## 2023-12-18 DIAGNOSIS — I10 Essential (primary) hypertension: Secondary | ICD-10-CM | POA: Diagnosis not present

## 2023-12-18 DIAGNOSIS — I739 Peripheral vascular disease, unspecified: Secondary | ICD-10-CM | POA: Diagnosis not present

## 2023-12-18 DIAGNOSIS — E222 Syndrome of inappropriate secretion of antidiuretic hormone: Secondary | ICD-10-CM | POA: Diagnosis not present

## 2023-12-18 DIAGNOSIS — Z299 Encounter for prophylactic measures, unspecified: Secondary | ICD-10-CM | POA: Diagnosis not present

## 2023-12-18 DIAGNOSIS — J309 Allergic rhinitis, unspecified: Secondary | ICD-10-CM | POA: Diagnosis not present

## 2024-01-05 DIAGNOSIS — Z0000 Encounter for general adult medical examination without abnormal findings: Secondary | ICD-10-CM | POA: Diagnosis not present

## 2024-01-05 DIAGNOSIS — Z1389 Encounter for screening for other disorder: Secondary | ICD-10-CM | POA: Diagnosis not present

## 2024-01-05 DIAGNOSIS — I10 Essential (primary) hypertension: Secondary | ICD-10-CM | POA: Diagnosis not present

## 2024-01-05 DIAGNOSIS — Z7189 Other specified counseling: Secondary | ICD-10-CM | POA: Diagnosis not present

## 2024-01-05 DIAGNOSIS — Z299 Encounter for prophylactic measures, unspecified: Secondary | ICD-10-CM | POA: Diagnosis not present

## 2024-01-22 DIAGNOSIS — H40011 Open angle with borderline findings, low risk, right eye: Secondary | ICD-10-CM | POA: Diagnosis not present

## 2024-02-23 DIAGNOSIS — E871 Hypo-osmolality and hyponatremia: Secondary | ICD-10-CM | POA: Diagnosis not present

## 2024-02-23 DIAGNOSIS — E559 Vitamin D deficiency, unspecified: Secondary | ICD-10-CM | POA: Diagnosis not present

## 2024-02-23 DIAGNOSIS — N189 Chronic kidney disease, unspecified: Secondary | ICD-10-CM | POA: Diagnosis not present

## 2024-02-23 DIAGNOSIS — R809 Proteinuria, unspecified: Secondary | ICD-10-CM | POA: Diagnosis not present

## 2024-02-23 DIAGNOSIS — D631 Anemia in chronic kidney disease: Secondary | ICD-10-CM | POA: Diagnosis not present

## 2024-03-01 DIAGNOSIS — E871 Hypo-osmolality and hyponatremia: Secondary | ICD-10-CM | POA: Diagnosis not present

## 2024-03-01 DIAGNOSIS — I10 Essential (primary) hypertension: Secondary | ICD-10-CM | POA: Diagnosis not present

## 2024-03-01 DIAGNOSIS — N182 Chronic kidney disease, stage 2 (mild): Secondary | ICD-10-CM | POA: Diagnosis not present

## 2024-04-06 DIAGNOSIS — H353111 Nonexudative age-related macular degeneration, right eye, early dry stage: Secondary | ICD-10-CM | POA: Diagnosis not present

## 2024-04-14 DIAGNOSIS — R5383 Other fatigue: Secondary | ICD-10-CM | POA: Diagnosis not present

## 2024-04-14 DIAGNOSIS — Z79899 Other long term (current) drug therapy: Secondary | ICD-10-CM | POA: Diagnosis not present

## 2024-04-14 DIAGNOSIS — I10 Essential (primary) hypertension: Secondary | ICD-10-CM | POA: Diagnosis not present

## 2024-04-14 DIAGNOSIS — Z299 Encounter for prophylactic measures, unspecified: Secondary | ICD-10-CM | POA: Diagnosis not present

## 2024-04-14 DIAGNOSIS — Z0000 Encounter for general adult medical examination without abnormal findings: Secondary | ICD-10-CM | POA: Diagnosis not present

## 2024-04-14 DIAGNOSIS — E7800 Pure hypercholesterolemia, unspecified: Secondary | ICD-10-CM | POA: Diagnosis not present

## 2024-04-16 NOTE — Progress Notes (Signed)
 Office Visit Note  Patient: Jane [name]             Date of Birth: May 11, 1941           MRN: [id number]             PCP: Rosamond Leta NOVAK, MD Referring: Rosamond Leta NOVAK, MD Visit Date: 04/29/2024 Occupation: @GUAROCC @  Subjective:  Left knee pain  History of Present Illness: Jane [name] is a 83 y.o. female with osteoarthritis and osteoporosis.  She returns today after her last visit in June 2021.  Patient states that she continues to have pain and discomfort in multiple joints.  She continues to have a stiffness in her hands.  She has several gadgets at her phone which helps her to hold objects in 2 swallows.  She had left total knee replacement in 2018 by Dr. Hiram.  Patient states that the pain never resolved after the total knee replacement.  She even tried nerve block which was unsuccessful.  She is in constant discomfort.  She states her left knee joint buckles and gives out on her.  She has had multiple falls due to her knee buckling on her.  Her last fall was in June 2024.  She states she had traumatic brain injury from it.  She states it is very difficult for her to get a follow-up appointment with Dr.Alusio.  She has an appointment with his PA in November.  She also has some discomfort in her right knee that she believes is from favoring her left knee.  She states she has been going to a chiropractor who has been very helpful.  She also has been getting physical therapy at home.  She was doing laser treatment on her left knee joint which made the pain more severe.  She continues to have some stiffness in her feet, neck and lower back.  None of the joints are swollen.  She was accompanied by her daughter [name] today.    Activities of Daily Living:  Patient reports morning stiffness for it depends all day.   Patient Reports nocturnal pain.  Difficulty dressing/grooming: Denies Difficulty climbing stairs: Reports Difficulty getting out of chair: Denies Difficulty using hands for taps,  buttons, cutlery, and/or writing: Reports  Review of Systems  Constitutional:  Positive for fatigue.  HENT:  Positive for mouth dryness. Negative for mouth sores.   Eyes:  Positive for dryness.  Respiratory:  Negative for difficulty breathing.   Cardiovascular:  Negative for chest pain and palpitations.  Gastrointestinal:  Negative for blood in stool, constipation and diarrhea.  Endocrine: Negative for increased urination.  Genitourinary:  Negative for involuntary urination.  Musculoskeletal:  Positive for joint pain, gait problem, joint pain, joint swelling, myalgias, morning stiffness, muscle tenderness and myalgias. Negative for muscle weakness.  Skin:  Negative for color change, rash, hair loss and sensitivity to sunlight.  Allergic/Immunologic: Negative for susceptible to infections.  Neurological:  Positive for dizziness. Negative for headaches.  Hematological:  Negative for swollen glands.  Psychiatric/Behavioral:  Positive for depressed mood and sleep disturbance. The patient is nervous/anxious.     PMFS History:  Patient Active Problem List   Diagnosis Date Noted   Primary osteoarthritis of both hands 03/09/2019   Primary osteoarthritis of both feet 03/09/2019   H/O total knee replacement, left 03/09/2019   Age-related osteoporosis without current pathological fracture 03/09/2019   DDD (degenerative disc disease), cervical 03/09/2019   Hx of colonic polyps 06/13/2017   Family history  of colon cancer 06/13/2017   OA (osteoarthritis) of knee 12/16/2016    Past Medical History:  Diagnosis Date   Arthritis    Chronic hypernatremia    Depression    H/O: rheumatic fever    as child   Heart murmur    Hx of colonic polyps    Hyperlipidemia    Hypertension    Osteoporosis 08/2023    Family History  Problem Relation Age of Onset   Colon cancer Mother    Heart Problems Father    COPD Sister    Hypertension Sister    Diabetes Sister    Thyroid  disease Sister     Hypertension Sister    Healthy Son    Healthy Daughter    Past Surgical History:  Procedure Laterality Date   CARDIAC CATHETERIZATION     CATARACT EXTRACTION Bilateral    COLONOSCOPY  06/03/2012   Procedure: COLONOSCOPY;  Surgeon: Claudis RAYMOND Rivet, MD;  Location: [facility];  Service: Endoscopy;  Laterality: N/A;  1200   COLONOSCOPY N/A 11/26/2017   Procedure: COLONOSCOPY;  Surgeon: Rivet Claudis RAYMOND, MD;  Location: [facility];  Service: Endoscopy;  Laterality: N/A;  830   FACIAL COSMETIC SURGERY     KNEE ARTHROPLASTY     RHINOPLASTY     TOE SURGERY     TONSILLECTOMY     TOTAL KNEE ARTHROPLASTY Left 12/16/2016   Procedure: LEFT TOTAL KNEE ARTHROPLASTY;  Surgeon: Dempsey Moan, MD;  Location: [facility];  Service: Orthopedics;  Laterality: Left;  requests   TUBAL LIGATION     Social History   Social History Narrative   Not on file   Immunization History  Administered Date(s) Administered   Moderna Sars-Covid-2 Vaccination 09/21/2019, 11/01/2019, 07/19/2020     Objective: Vital Signs: BP (!) 149/78 (BP Location: Right Arm, Patient Position: Sitting, Cuff Size: Normal)   Pulse 70   Resp 14   Ht 5' 0.5 (1.537 m)   Wt 163 lb 9.6 oz (74.2 kg)   BMI 31.43 kg/m    Physical Exam Vitals and nursing note reviewed.  Constitutional:      Appearance: She is well-developed.  HENT:     Head: Normocephalic and atraumatic.  Eyes:     Conjunctiva/sclera: Conjunctivae normal.  Cardiovascular:     Rate and Rhythm: Normal rate and regular rhythm.     Heart sounds: Normal heart sounds.  Pulmonary:     Effort: Pulmonary effort is normal.     Breath sounds: Normal breath sounds.  Abdominal:     General: Bowel sounds are normal.     Palpations: Abdomen is soft.  Musculoskeletal:     Cervical back: Normal range of motion.  Lymphadenopathy:     Cervical: No cervical adenopathy.  Skin:    General: Skin is warm and dry.     Capillary Refill: Capillary refill takes less than 2  seconds.  Neurological:     Mental Status: She is alert and oriented to person, place, and time.  Psychiatric:        Behavior: Behavior normal.      Musculoskeletal Exam: She has good range of motion of the cervical spine.  Thoracic kyphosis was noted.  She had no tenderness over thoracic or lumbar spine.  Shoulders and elbow joints were in good range of motion.  Wrist joints and MCP joints with good range of motion with no synovitis.  She had bilateral PIP and DIP thickening with incomplete fist formation due to  limited range of motion of her bilateral middle fingers.  No synovitis was noted.  Hip joints were in good range of motion.  Right knee joint was in range of motion without any warmth swelling or effusion.  Left knee joint was replaced with painful range of motion without any effusion.  She had no tenderness over her ankles or MTPs.  CDAI Exam: CDAI Score: -- Patient Global: --; Provider Global: -- Swollen: --; Tender: -- Joint Exam 04/29/2024   No joint exam has been documented for this visit   There is currently no information documented on the homunculus. Go to the Rheumatology activity and complete the homunculus joint exam.  Investigation: No additional findings.  Imaging: No results found.  Recent Labs: Lab Results  Component Value Date   WBC 10.3 10/02/2017   HGB 12.6 11/26/2017   PLT 224 10/02/2017   NA 131 (L) 10/02/2017   K 3.6 10/02/2017   CL 94 (L) 10/02/2017   CO2 26 10/02/2017   GLUCOSE 113 (H) 10/02/2017   BUN 21 (H) 10/02/2017   CREATININE 0.90 10/02/2017   BILITOT 0.6 12/11/2016   ALKPHOS 58 12/11/2016   AST 22 12/11/2016   ALT 22 12/11/2016   PROT 7.2 12/11/2016   ALBUMIN 4.5 12/11/2016   CALCIUM 9.1 10/02/2017   GFRAA >60 10/02/2017    Speciality Comments: No specialty comments available.  Procedures:  No procedures performed Allergies: Metronidazole, Ciprofloxacin hcl, Clindamycin/lincomycin, Etodolac, Gabapentin , Other, and  Penicillins   Assessment / Plan:     Visit Diagnoses: Primary osteoarthritis of both hands-she had bilateral PIP and DIP thickening with limited range of motion of some of the PIP joints in her bilateral middle fingers.  No synovitis was noted.  Joint protection muscle strengthening was discussed.  Primary osteoarthritis of right knee-she has been experiencing some discomfort in the right knee joint.  No warmth swelling or effusion was noted.  H/O total knee replacement, left-she is very much concerned about the left knee joint pain.  She had a total knee replacement in 2018.  Patient states she had no relief in discomfort in her left knee.  She reports that her left knee buckles and gives out on her.  She states she has had multiple falls since her surgery.  The last fall was in June 2024 for which she had traumatic brain injury.  She has been getting home physical therapy.  She states she also had a nerve block which did not help.  She has an appointment coming up with [facility] in November 2025.  I offered her referral to pain management.  Patient stated that she will contact me when she is ready for pain management referral.  At this time she would like to hold off.  Primary osteoarthritis of both feet-proper fitting shoes were advised.  DDD (degenerative disc disease), cervical-she had good range of motion without discomfort.  Frequent falls-patient states she falls frequently due to buckling of her left knee joint.  She was advised to continue with the physical therapy.  She ambulates with the help of a cane.  I advised her to discuss possible use of walker.  Age-related osteoporosis without current pathological fracture-DEXA scan is followed by her PCP.  She is on Fosamax 70 mg p.o. weekly.  Primary hypertension blood pressure was elevated at 162/73, repeat blood pressure was 149/78.  She was advised to monitor blood pressure closely and follow-up with her PCP.  Hx of colonic  polyps  Family history of colon cancer  Orders: No orders of the defined types were placed in this encounter.  No orders of the defined types were placed in this encounter.   Face-to-face time spent with patient was 30 minutes. Greater than 50% of time was spent in counseling and coordination of care.  Follow-Up Instructions: Return if symptoms worsen or fail to improve, for Osteoarthritis.   Maya Nash, MD  Note - This record has been created using Animal nutritionist.  Chart creation errors have been sought, but may not always  have been located. Such creation errors do not reflect on  the standard of medical care.

## 2024-04-27 ENCOUNTER — Ambulatory Visit: Attending: Cardiology | Admitting: Cardiology

## 2024-04-27 ENCOUNTER — Encounter: Payer: Self-pay | Admitting: Cardiology

## 2024-04-27 VITALS — BP 126/66 | HR 68 | Ht 61.0 in | Wt 161.0 lb

## 2024-04-27 DIAGNOSIS — E782 Mixed hyperlipidemia: Secondary | ICD-10-CM | POA: Diagnosis not present

## 2024-04-27 DIAGNOSIS — I10 Essential (primary) hypertension: Secondary | ICD-10-CM | POA: Diagnosis not present

## 2024-04-27 NOTE — Progress Notes (Signed)
 Clinical Summary Ms. Qian is a 83 y.o.female seen today for follow up of the following medical problems.     1. Chronic LBBB - reports history 20 years of LBBB, she reports iniitally LBBB was rate related. Diagnosed in 1996, at that time rates 160-170 on treadmill stress she developed LBBB - heart cath 2005 was normal   - 11/2016 echo LVEF 55-60%. - 12/2016 nuclear stress test no ischemia  - no recent issues       2. Valvular heart disease - 11/2016 echo mild to mod MR, moderate TR - no recent symptoms -08/2020 echo LVEF 60-65%, no significant valve dysfunction - no recent symptoms.        3. HTN  - issues with constipation on norvasc   - Has some component of white coat HTN. - 2 years of dry cough, pcp has been reluctant to change ACE-I per her report due to prior issues with several other meds  - issues with low Na, avoiding diuretic.       4. Hyperlipidemia - she is on simvastastin - 04/2024 TC 184 TG 90 HDL 65 LDL 103  5. History of falls/TBI  6. Chronic knee pain  7.Hyponatremia - followed by Dr Rachele Past Medical History:  Diagnosis Date   Arthritis    Depression    H/O: rheumatic fever    as child   Heart murmur    Hx of colonic polyps    Hypertension      Allergies  Allergen Reactions   Ciprofloxacin Hcl Other (See Comments)    Hand stiffness    Clindamycin/Lincomycin Other (See Comments)    Per patient, give me c-diff   Flagyl [Metronidazole] Hives and Other (See Comments)    Questionable if this medication was the cause.   Gabapentin      Dry cough, hoarseness   Lodine [Etodolac] Other (See Comments)    Stomach pain    Other Other (See Comments)    Has a very low tolerance to sedation and stimulating drugs    Penicillins Diarrhea, Rash and Other (See Comments)    Has patient had a PCN reaction causing immediate rash, facial/tongue/throat swelling, SOB or lightheadedness with hypotension: unknown Has patient had a PCN reaction  causing severe rash involving mucus membranes or skin necrosis: unknown Has patient had a PCN reaction that required hospitalization unknown Has patient had a PCN reaction occurring within the last 10 years: No If all of the above answers are NO, then may proceed with Cephalosporin use.      Current Outpatient Medications  Medication Sig Dispense Refill   acetaminophen  (TYLENOL ) 325 MG tablet Take 650 mg by mouth every 6 (six) hours as needed.     ALPRAZolam (XANAX) 0.25 MG tablet Take 0.25 mg by mouth 2 (two) times daily as needed.     ALPRAZolam (XANAX) 0.5 MG tablet Take 0.25 mg by mouth 2 (two) times daily as needed. (Patient not taking: Reported on 11/06/2023)     aspirin EC 81 MG tablet Take 81 mg by mouth at bedtime.     Calcium Carbonate-Vitamin D (CALTRATE 600+D PO) Take 1 tablet by mouth 2 (two) times daily. (Patient not taking: Reported on 11/06/2023)     Calcium Citrate-Vitamin D (CALCIUM CITRATE + PO) Take by mouth daily.     Cholecalciferol (VITAMIN D3) 2000 units TABS Take 2,000 Units by mouth daily.     citalopram  (CELEXA ) 40 MG tablet Take 20 mg by mouth at bedtime. Doing  this 4 days out of the week, all other days is 40 mg the other 3 days - will be cutting back to 20mg  daily     DICLOFENAC  PO Take by mouth.     diclofenac  sodium (VOLTAREN ) 1 % GEL Apply to affected area 3-4 times daily PRN 3 Tube 3   furosemide (LASIX) 20 MG tablet Take 10 mg by mouth 2 (two) times daily.     lisinopril (PRINIVIL,ZESTRIL) 20 MG tablet Take 20 mg by mouth 2 (two) times daily.     magnesium  oxide (MAG-OX) 400 MG tablet Take 400 mg by mouth daily.     meclizine  (ANTIVERT ) 25 MG tablet Take 12.5-25 mg by mouth daily as needed for dizziness.      Multiple Vitamins-Minerals (CENTRUM SILVER PO) Take 1 tablet by mouth daily.     simvastatin  (ZOCOR ) 20 MG tablet Take 20 mg by mouth at bedtime.     No current facility-administered medications for this visit.     Past Surgical History:   Procedure Laterality Date   CARDIAC CATHETERIZATION     COLONOSCOPY  06/03/2012   Procedure: COLONOSCOPY;  Surgeon: Claudis RAYMOND Rivet, MD;  Location: [facility];  Service: Endoscopy;  Laterality: N/A;  1200   COLONOSCOPY N/A 11/26/2017   Procedure: COLONOSCOPY;  Surgeon: Rivet Claudis RAYMOND, MD;  Location: [facility];  Service: Endoscopy;  Laterality: N/A;  830   FACIAL COSMETIC SURGERY     KNEE ARTHROPLASTY     RHINOPLASTY     TOE SURGERY     TONSILLECTOMY     TOTAL KNEE ARTHROPLASTY Left 12/16/2016   Procedure: LEFT TOTAL KNEE ARTHROPLASTY;  Surgeon: Dempsey Moan, MD;  Location: [facility];  Service: Orthopedics;  Laterality: Left;  requests   TUBAL LIGATION       Allergies  Allergen Reactions   Ciprofloxacin Hcl Other (See Comments)    Hand stiffness    Clindamycin/Lincomycin Other (See Comments)    Per patient, give me c-diff   Flagyl [Metronidazole] Hives and Other (See Comments)    Questionable if this medication was the cause.   Gabapentin      Dry cough, hoarseness   Lodine [Etodolac] Other (See Comments)    Stomach pain    Other Other (See Comments)    Has a very low tolerance to sedation and stimulating drugs    Penicillins Diarrhea, Rash and Other (See Comments)    Has patient had a PCN reaction causing immediate rash, facial/tongue/throat swelling, SOB or lightheadedness with hypotension: unknown Has patient had a PCN reaction causing severe rash involving mucus membranes or skin necrosis: unknown Has patient had a PCN reaction that required hospitalization unknown Has patient had a PCN reaction occurring within the last 10 years: No If all of the above answers are NO, then may proceed with Cephalosporin use.       Family History  Problem Relation Age of Onset   Colon cancer Mother    Heart Problems Father    COPD Sister    Hypertension Sister    Diabetes Sister    Thyroid  disease Sister    Hypertension Sister    Healthy Son    Healthy Daughter       Social History Ms. Touchette reports that she has never smoked. She has never used smokeless tobacco. Ms. Zobel reports current alcohol  use.    Physical Examination Today's Vitals   04/27/24 1608  BP: 126/66  Pulse: 68  SpO2: 97%  Weight: 161 lb (  73 kg)  Height: 5' 1 (1.549 m)   Body mass index is 30.42 kg/m.  Gen: resting comfortably, no acute distress HEENT: no scleral icterus, pupils equal round and reactive, no palptable cervical adenopathy,  CV: RRR, no mrg, no jvd. +Left sided carotid bruit Resp: Clear to auscultation bilaterally GI: abdomen is soft, non-tender, non-distended, normal bowel sounds, no hepatosplenomegaly MSK: extremities are warm, no edema.  Skin: warm, no rash Neuro:  no focal deficits Psych: appropriate affect   Diagnostic Studies  11/2016 echo [facility] LVEF 55-60%,   12/2016 Nuclear stress The study is normal. This is a low risk study. The left ventricular ejection fraction is normal (55-65%). There was no ST segment deviation noted during stress.   Normal resting and stress perfusion. No ischemia or infarction EF 54%      08/2020 echo IMPRESSIONS     1. Left ventricular ejection fraction, by estimation, is 60 to 65%. The  left ventricle has normal function. The left ventricle has no regional  wall motion abnormalities. Left ventricular diastolic parameters were  normal.   2. Right ventricular systolic function is normal. The right ventricular  size is normal. There is normal pulmonary artery systolic pressure.   3. Left atrial size was mildly dilated.   4. The mitral valve is normal in structure. No evidence of mitral valve  regurgitation. No evidence of mitral stenosis.   5. The aortic valve is tricuspid. Aortic valve regurgitation is not  visualized. No aortic stenosis is present.   6. The inferior vena cava is normal in size with greater than 50%  respiratory variability, suggesting right atrial pressure of 3 mmHg.     Assessment and Plan     1. HTN -bp is at goal, continue current meds   2. HLD - at goal, continue current meds  F/u 1 year     Dorn PHEBE Ross, M.D.

## 2024-04-27 NOTE — Patient Instructions (Addendum)

## 2024-04-29 ENCOUNTER — Encounter: Payer: Self-pay | Admitting: Rheumatology

## 2024-04-29 ENCOUNTER — Ambulatory Visit: Attending: Rheumatology | Admitting: Rheumatology

## 2024-04-29 VITALS — BP 149/78 | HR 70 | Resp 14 | Ht 60.5 in | Wt 163.6 lb

## 2024-04-29 DIAGNOSIS — M1711 Unilateral primary osteoarthritis, right knee: Secondary | ICD-10-CM

## 2024-04-29 DIAGNOSIS — Z860100 Personal history of colon polyps, unspecified: Secondary | ICD-10-CM

## 2024-04-29 DIAGNOSIS — Z96652 Presence of left artificial knee joint: Secondary | ICD-10-CM

## 2024-04-29 DIAGNOSIS — M810 Age-related osteoporosis without current pathological fracture: Secondary | ICD-10-CM

## 2024-04-29 DIAGNOSIS — I10 Essential (primary) hypertension: Secondary | ICD-10-CM | POA: Diagnosis not present

## 2024-04-29 DIAGNOSIS — M5030 Other cervical disc degeneration, unspecified cervical region: Secondary | ICD-10-CM

## 2024-04-29 DIAGNOSIS — Z800 Family history of malignant neoplasm of digestive organs: Secondary | ICD-10-CM | POA: Diagnosis not present

## 2024-04-29 DIAGNOSIS — M19042 Primary osteoarthritis, left hand: Secondary | ICD-10-CM | POA: Diagnosis not present

## 2024-04-29 DIAGNOSIS — M19041 Primary osteoarthritis, right hand: Secondary | ICD-10-CM

## 2024-04-29 DIAGNOSIS — M19072 Primary osteoarthritis, left ankle and foot: Secondary | ICD-10-CM

## 2024-04-29 DIAGNOSIS — R296 Repeated falls: Secondary | ICD-10-CM | POA: Diagnosis not present

## 2024-04-29 DIAGNOSIS — M19071 Primary osteoarthritis, right ankle and foot: Secondary | ICD-10-CM | POA: Diagnosis not present

## 2024-04-29 NOTE — Patient Instructions (Signed)
 Exercises for Chronic Knee Pain  Chronic knee pain is pain that lasts longer than 3 months. For most people with chronic knee pain, exercise and weight loss is an important part of treatment. Your health care provider may want you to focus on:  Making the muscles that support your knee stronger. This can take pressure off your knee and reduce pain.  Preventing knee stiffness.  How far you can move your knee, keeping it there or making it farther.  Losing weight (if this applies) to take pressure off your knee, lower your risk for injury, and make it easier for you to exercise.  Your provider will help you make an exercise program that fits your needs and physical abilities. Below are simple, low-impact exercises you can do at home. Ask your provider or physical therapist how often you should do your exercise program and how many times to repeat each exercise.  General safety tips    Get your provider's approval before doing any exercises.  Start slowly and stop any time you feel pain.  Do not exercise if your knee pain is flaring up.  Warm up first. Stretching a cold muscle can cause an injury. Do 5-10 minutes of easy movement or light stretching before beginning your exercises.  Do 5-10 minutes of low-impact activity (like walking or cycling) before starting strengthening exercises.  Contact your provider any time you have pain during or after exercising. Exercise can cause discomfort but should not be painful. It is normal to be a little stiff or sore after exercising.  Stretching and range-of-motion exercises  Front thigh stretch    Stand up straight and support your body by holding on to a chair or resting one hand on a [place]rea.  With your legs straight and close together, bend one knee to lift your heel up toward your butt.  Using one hand for support, grab your ankle with your free hand.  Pull your foot up closer toward your butt to feel the stretch in front of your thigh.  Hold the stretch for 30  seconds.  Repeat __________ times. Complete this exercise __________ times a day.  Back thigh stretch    Sit on the floor with your back straight and your legs out straight in front of you.  Place the palms of your hands on the floor and slide them toward your feet as you bend at the hip.  Try to touch your nose to your knees and feel the stretch in the back of your thighs.  Hold for 30 seconds.  Repeat __________ times. Complete this exercise __________ times a day.  Calf stretch    Stand facing a [place]ehy.  Place the palms of your hands flat against the Ghazi, arms extended, and lean slightly against the Muha.  Get into a lunge position with one leg bent at the knee and the other leg stretched out straight behind you.  Keep both feet facing the Z[place]mer and increase the bend in your knee while keeping the heel of the other leg flat on the ground.  You should feel the stretch in your calf. Hold for 30 seconds.  Repeat __________ times. Complete this exercise __________ times a day.  Strengthening exercises  Straight leg lift    Lie on your back with one knee bent and the other leg out straight.  Slowly lift the straight leg without bending the knee.  Lift until your foot is about 12 inches (30 cm) off the floor.  Hold for  3-5 seconds and slowly lower your leg.  Repeat __________ times. Complete this exercise __________ times a day.  Single leg dip    Stand between two chairs and put both hands on the backs of the chairs for support.  Extend one leg out straight with your body weight resting on the heel of the standing leg.  Slowly bend your standing knee to dip your body to the level that is comfortable for you.  Hold for 3-5 seconds.  Repeat __________ times. Complete this exercise __________ times a day.  Hamstring curls    Stand straight, knees close together, facing the back of a chair.  Hold on to the back of a chair with both hands.  Keep one leg straight. Bend the other knee while bringing the heel up toward the butt  until the knee is bent at a 90-degree angle (right angle).  Hold for 3-5 seconds.  Repeat __________ times. Complete this exercise __________ times a day.  Neece squat    Stand straight with your back, hips, and head against a R[place].  Step forward one foot at a time with your back still against the [place].  Your feet should be 2 feet (61 cm) from the Jeanpaul at shoulder width. Keeping your back, hips, and head against the [place], slide down the Brenner to as close to a sitting position as you can get.  Hold for 5-10 seconds, then slowly slide back up.  Repeat __________ times. Complete this exercise __________ times a day.  Step-ups    Stand in front of a sturdy platform or stool that is about 6 inches (15 cm) high.  Slowly step up with your left / right foot, keeping your knee in line with your hip and foot. Do not let your knee bend so far that you cannot see your toes. Hold on to a chair for balance, but do not use it for support.  Slowly unlock your knee and lower yourself to the starting position.  Repeat __________ times. Complete this exercise __________ times a day.  Contact a health care provider if:  Your exercises cause pain.  Your pain is worse after you exercise.  Your pain prevents you from doing your exercises.  This information is not intended to replace advice given to you by your health care provider. Make sure you discuss any questions you have with your health care provider.  Document Revised: 09/10/2022 Document Reviewed: 09/10/2022  [employer]vier Patient Education  2024 [employer].

## 2024-05-18 DIAGNOSIS — M25561 Pain in right knee: Secondary | ICD-10-CM | POA: Diagnosis not present

## 2024-05-18 DIAGNOSIS — M25562 Pain in left knee: Secondary | ICD-10-CM | POA: Diagnosis not present

## 2024-05-24 ENCOUNTER — Encounter: Payer: Self-pay | Admitting: Orthopedic Surgery

## 2024-05-24 DIAGNOSIS — M25562 Pain in left knee: Secondary | ICD-10-CM

## 2024-06-01 ENCOUNTER — Ambulatory Visit: Admitting: Rheumatology

## 2024-06-07 DIAGNOSIS — M179 Osteoarthritis of knee, unspecified: Secondary | ICD-10-CM | POA: Diagnosis not present

## 2024-06-07 DIAGNOSIS — R52 Pain, unspecified: Secondary | ICD-10-CM | POA: Diagnosis not present

## 2024-06-07 DIAGNOSIS — Z299 Encounter for prophylactic measures, unspecified: Secondary | ICD-10-CM | POA: Diagnosis not present

## 2024-06-07 DIAGNOSIS — M545 Low back pain, unspecified: Secondary | ICD-10-CM | POA: Diagnosis not present

## 2024-06-07 DIAGNOSIS — R0789 Other chest pain: Secondary | ICD-10-CM | POA: Diagnosis not present

## 2024-06-08 DIAGNOSIS — N281 Cyst of kidney, acquired: Secondary | ICD-10-CM | POA: Diagnosis not present

## 2024-06-08 DIAGNOSIS — Z7982 Long term (current) use of aspirin: Secondary | ICD-10-CM | POA: Diagnosis not present

## 2024-06-08 DIAGNOSIS — K449 Diaphragmatic hernia without obstruction or gangrene: Secondary | ICD-10-CM | POA: Diagnosis not present

## 2024-06-08 DIAGNOSIS — R112 Nausea with vomiting, unspecified: Secondary | ICD-10-CM | POA: Diagnosis not present

## 2024-06-08 DIAGNOSIS — R109 Unspecified abdominal pain: Secondary | ICD-10-CM | POA: Diagnosis not present

## 2024-06-08 DIAGNOSIS — A059 Bacterial foodborne intoxication, unspecified: Secondary | ICD-10-CM | POA: Diagnosis not present

## 2024-06-08 DIAGNOSIS — Z79899 Other long term (current) drug therapy: Secondary | ICD-10-CM | POA: Diagnosis not present

## 2024-06-08 DIAGNOSIS — K529 Noninfective gastroenteritis and colitis, unspecified: Secondary | ICD-10-CM | POA: Diagnosis not present

## 2024-06-11 DIAGNOSIS — K529 Noninfective gastroenteritis and colitis, unspecified: Secondary | ICD-10-CM | POA: Diagnosis not present

## 2024-06-11 DIAGNOSIS — I10 Essential (primary) hypertension: Secondary | ICD-10-CM | POA: Diagnosis not present

## 2024-06-11 DIAGNOSIS — R110 Nausea: Secondary | ICD-10-CM | POA: Diagnosis not present

## 2024-06-11 DIAGNOSIS — Z299 Encounter for prophylactic measures, unspecified: Secondary | ICD-10-CM | POA: Diagnosis not present

## 2024-07-14 ENCOUNTER — Encounter (INDEPENDENT_AMBULATORY_CARE_PROVIDER_SITE_OTHER): Payer: Self-pay | Admitting: Gastroenterology

## 2024-07-20 ENCOUNTER — Ambulatory Visit: Admitting: Cardiology

## 2024-09-10 ENCOUNTER — Other Ambulatory Visit (HOSPITAL_BASED_OUTPATIENT_CLINIC_OR_DEPARTMENT_OTHER): Payer: Self-pay

## 2024-09-20 ENCOUNTER — Other Ambulatory Visit (HOSPITAL_BASED_OUTPATIENT_CLINIC_OR_DEPARTMENT_OTHER): Payer: Self-pay

## 2024-09-20 MED ORDER — MIRTAZAPINE 7.5 MG PO TABS
7.5000 mg | ORAL_TABLET | Freq: Every day | ORAL | 2 refills | Status: AC
  Filled 2024-10-07: qty 30, 30d supply, fill #0

## 2024-09-20 MED ORDER — ALENDRONATE SODIUM 70 MG PO TABS
70.0000 mg | ORAL_TABLET | ORAL | 11 refills | Status: AC
  Filled 2024-09-20: qty 4, 28d supply, fill #0

## 2024-09-20 MED ORDER — CELECOXIB 100 MG PO CAPS
100.0000 mg | ORAL_CAPSULE | Freq: Every day | ORAL | 3 refills | Status: AC
  Filled 2024-09-20: qty 30, 30d supply, fill #0

## 2024-09-21 ENCOUNTER — Other Ambulatory Visit (HOSPITAL_BASED_OUTPATIENT_CLINIC_OR_DEPARTMENT_OTHER): Payer: Self-pay

## 2024-09-22 ENCOUNTER — Other Ambulatory Visit (HOSPITAL_BASED_OUTPATIENT_CLINIC_OR_DEPARTMENT_OTHER): Payer: Self-pay

## 2024-10-07 ENCOUNTER — Other Ambulatory Visit (HOSPITAL_BASED_OUTPATIENT_CLINIC_OR_DEPARTMENT_OTHER): Payer: Self-pay

## 2024-10-07 MED ORDER — FUROSEMIDE 20 MG PO TABS: 10.0000 mg | ORAL_TABLET | Freq: Two times a day (BID) | ORAL | 0 refills | Status: AC

## 2024-10-07 MED ORDER — FUROSEMIDE 20 MG PO TABS
10.0000 mg | ORAL_TABLET | Freq: Two times a day (BID) | ORAL | 0 refills | Status: AC
  Filled 2024-10-07: qty 90, 90d supply, fill #0
